# Patient Record
Sex: Female | Born: 1970 | Race: White | Hispanic: No | Marital: Married
Health system: Midwestern US, Community
[De-identification: ages and names within clinical notes are randomized; demographics above are authoritative.]

## PROBLEM LIST (undated history)

## (undated) DIAGNOSIS — Z170 Estrogen receptor positive status [ER+]: Secondary | ICD-10-CM

## (undated) DIAGNOSIS — C50911 Malignant neoplasm of unspecified site of right female breast: Principal | ICD-10-CM

## (undated) DIAGNOSIS — C50111 Malignant neoplasm of central portion of right female breast: Principal | ICD-10-CM

---

## 2019-09-06 ENCOUNTER — Emergency Department (HOSPITAL_COMMUNITY): Payer: No Typology Code available for payment source

## 2019-09-06 ENCOUNTER — Encounter (HOSPITAL_COMMUNITY): Payer: Self-pay

## 2019-09-06 ENCOUNTER — Other Ambulatory Visit: Payer: Self-pay

## 2019-09-06 ENCOUNTER — Emergency Department (HOSPITAL_COMMUNITY)
Admission: EM | Admit: 2019-09-06 | Discharge: 2019-09-06 | Disposition: A | Discharge status: Home / Self Care | Payer: No Typology Code available for payment source | Attending: Emergency Medicine | Admitting: Emergency Medicine

## 2019-09-06 DIAGNOSIS — R202 Paresthesia of skin: Secondary | ICD-10-CM | POA: Diagnosis not present

## 2019-09-06 DIAGNOSIS — Z79899 Other long term (current) drug therapy: Secondary | ICD-10-CM | POA: Insufficient documentation

## 2019-09-06 DIAGNOSIS — R519 Headache, unspecified: Secondary | ICD-10-CM | POA: Insufficient documentation

## 2019-09-06 LAB — COMPREHENSIVE METABOLIC PANEL
ALT: 24 U/L (ref 0–44)
AST: 22 U/L (ref 15–41)
Albumin: 3.9 g/dL (ref 3.5–5.0)
Alkaline Phosphatase: 79 U/L (ref 38–126)
Anion gap: 9 (ref 5–15)
BUN: 11 mg/dL (ref 6–20)
CO2: 26 mmol/L (ref 22–32)
Calcium: 9.8 mg/dL (ref 8.9–10.3)
Chloride: 104 mmol/L (ref 98–111)
Creatinine, Ser: 0.82 mg/dL (ref 0.44–1.00)
GFR calc Af Amer: >60 mL/min (ref >60)
GFR calc non Af Amer: >60 mL/min (ref >60)
Glucose, Bld: 106 mg/dL — ABNORMAL HIGH (ref 70–99)
Potassium: 3.4 mmol/L — ABNORMAL LOW (ref 3.5–5.1)
Sodium: 139 mmol/L (ref 135–145)
Total Bilirubin: 0.7 mg/dL (ref 0.3–1.2)
Total Protein: 7.8 g/dL (ref 6.5–8.1)

## 2019-09-06 LAB — DIFFERENTIAL
Abs Immature Granulocytes: 0.02 10*3/uL (ref 0.00–0.07)
Basophils Absolute: 0 10*3/uL (ref 0.0–0.1)
Basophils Relative: 0 %
Eosinophils Absolute: 0.1 10*3/uL (ref 0.0–0.5)
Eosinophils Relative: 1 %
Immature Granulocytes: 0 %
Lymphocytes Relative: 26 %
Lymphs Abs: 2.5 10*3/uL (ref 0.7–4.0)
Monocytes Absolute: 0.7 10*3/uL (ref 0.1–1.0)
Monocytes Relative: 7 %
Neutro Abs: 6.5 10*3/uL (ref 1.7–7.7)
Neutrophils Relative %: 66 %

## 2019-09-06 LAB — CBC
HCT: 44.8 % (ref 36.0–46.0)
Hemoglobin: 15.7 g/dL — ABNORMAL HIGH (ref 12.0–15.0)
MCH: 30.9 pg (ref 26.0–34.0)
MCHC: 35 g/dL (ref 30.0–36.0)
MCV: 88.2 fL (ref 80.0–100.0)
Platelets: 276 10*3/uL (ref 150–400)
RBC: 5.08 MIL/uL (ref 3.87–5.11)
RDW: 12.1 % (ref 11.5–15.5)
WBC: 9.8 10*3/uL (ref 4.0–10.5)
nRBC: 0 % (ref 0.0–0.2)

## 2019-09-06 LAB — CBG MONITORING, ED: Glucose-Capillary: 95 mg/dL (ref 70–99)

## 2019-09-06 LAB — APTT: aPTT: 25 seconds (ref 24–36)

## 2019-09-06 LAB — I-STAT CHEM 8, ED
BUN: 12 mg/dL (ref 6–20)
Calcium, Ion: 1.21 mmol/L (ref 1.15–1.40)
Chloride: 104 mmol/L (ref 98–111)
Creatinine, Ser: 0.8 mg/dL (ref 0.44–1.00)
Glucose, Bld: 106 mg/dL — ABNORMAL HIGH (ref 70–99)
HCT: 45 % (ref 36.0–46.0)
Hemoglobin: 15.3 g/dL — ABNORMAL HIGH (ref 12.0–15.0)
Potassium: 3.4 mmol/L — ABNORMAL LOW (ref 3.5–5.1)
Sodium: 141 mmol/L (ref 135–145)
TCO2: 29 mmol/L (ref 22–32)

## 2019-09-06 LAB — PROTIME-INR
INR: 1 (ref 0.8–1.2)
Prothrombin Time: 13 seconds (ref 11.4–15.2)

## 2019-09-06 MED ORDER — SODIUM CHLORIDE 0.9% FLUSH
3.0000 mL | Freq: Once | INTRAVENOUS | Status: AC
  Administered 2019-09-06: 3 mL via INTRAVENOUS

## 2019-09-06 MED ORDER — SODIUM CHLORIDE 0.9 % IV BOLUS
1000.0000 mL | Freq: Once | INTRAVENOUS | Status: AC
  Administered 2019-09-06: 1000 mL via INTRAVENOUS

## 2019-09-06 MED ORDER — GADOBUTROL 1 MMOL/ML IV SOLN
7.4000 mL | Freq: Once | INTRAVENOUS | Status: AC | PRN
  Administered 2019-09-06: 7.4 mL via INTRAVENOUS

## 2019-09-06 MED ORDER — DIPHENHYDRAMINE HCL 50 MG/ML IJ SOLN
25.0000 mg | Freq: Once | INTRAMUSCULAR | Status: AC
  Administered 2019-09-06: 25 mg via INTRAVENOUS
  Filled 2019-09-06: qty 1

## 2019-09-06 MED ORDER — KETOROLAC TROMETHAMINE 15 MG/ML IJ SOLN
15.0000 mg | Freq: Once | INTRAMUSCULAR | Status: AC
  Administered 2019-09-06: 15 mg via INTRAVENOUS
  Filled 2019-09-06: qty 1

## 2019-09-06 MED ORDER — PROCHLORPERAZINE EDISYLATE 10 MG/2ML IJ SOLN
10.0000 mg | Freq: Once | INTRAMUSCULAR | Status: AC
  Administered 2019-09-06: 10 mg via INTRAVENOUS
  Filled 2019-09-06: qty 2

## 2019-09-06 NOTE — ED Notes (Signed)
Transported to CT 

## 2019-09-06 NOTE — ED Triage Notes (Signed)
Pt went to bed last night 10pm and was normal except for a headache she has had x 1 week on left parietal and frontal head wrapping behind left eye.  No blurred vision.  Pt woke up during last night with left arm numbness, resolved after a while of being up.  Went back to sleep and woke up with right arm numbness.  Right arm is still numb.

## 2019-09-06 NOTE — ED Notes (Signed)
Patient verbalizes understanding of discharge instructions. Opportunity for questioning and answers were provided. Armband removed by staff, pt discharged from ED to home via POV with husband 

## 2019-09-06 NOTE — Discharge Instructions (Addendum)
Your CT and MRI results of your head are within normal limits.  We discussed following up for blood pressure control with your primary care physician.  If you experience any new symptoms, blurred vision, worsening headache please return to the emergency room.

## 2019-09-06 NOTE — ED Provider Notes (Signed)
[facility] EMERGENCY DEPARTMENT Provider Note   CSN: [id number] Arrival date & time: 09/06/19  1507     History Chief Complaint  Patient presents with  . Numbness  . Headache    Lorey Pallett is a 49 y.o. female.  49 y.o female with no PMH presents to the ED with a chief complaint of headache x 1 week. Patient reports an intermittent headache that began a week ago which she describes as "my muscle headaches". Sharp radiating from the front to the back of her head. She reports this resolved Friday. Saturday she experienced wooziness, states she felt unsteady on her feet. Last night she woke up in the middle of the night, and experienced dizziness along with left arm numbness, reports going back to bed and woke up this morning with right arm numbness and headache has returned. She has taken advil, aleve without improvement in symptoms. States 'headache feels different", and right arm remains numb. No neck pain, no changes in vision, no thunderclap feeling, no vomiting.   The history is provided by the patient and medical records.       History reviewed. No pertinent past medical history.  There are no problems to display for this patient.   History reviewed. No pertinent surgical history.   OB History   No obstetric history on file.     History reviewed. No pertinent family history.  Social History   Tobacco Use  . Smoking status: Not on file  Substance Use Topics  . Alcohol use: Not on file  . Drug use: Not on file    Home Medications Prior to Admission medications   Not on File    Allergies    Patient has no known allergies.  Review of Systems   Review of Systems  Constitutional: Negative for fever.  HENT: Negative for sore throat.   Respiratory: Negative for shortness of breath.   Cardiovascular: Negative for chest pain.  Gastrointestinal: Negative for abdominal pain, diarrhea, nausea and vomiting.  Genitourinary: Negative  for flank pain.  Musculoskeletal: Negative for back pain and neck pain.  Skin: Negative for pallor and wound.  Neurological: Positive for dizziness, weakness, numbness and headaches. Negative for tremors, seizures, syncope, facial asymmetry, speech difficulty and light-headedness.  All other systems reviewed and are negative.   Physical Exam Updated Vital Signs BP (!) 150/90   Pulse 73   Temp 98.4 F (36.9 C)   Resp 12   Ht 5' (1.524 m)   Wt 74.8 kg   SpO2 97%   BMI 32.22 kg/m   Physical Exam Vitals and nursing note reviewed.  Constitutional:      Appearance: She is well-developed. She is not toxic-appearing.  HENT:     Head: Normocephalic and atraumatic.  Eyes:     Extraocular Movements: Extraocular movements intact.     Pupils: Pupils are equal, round, and reactive to light.     Right eye: Pupil is round and reactive.     Left eye: Pupil is round and reactive.  Cardiovascular:     Rate and Rhythm: Normal rate.  Pulmonary:     Effort: Pulmonary effort is normal.     Breath sounds: No wheezing.  Abdominal:     Palpations: Abdomen is soft.     Tenderness: There is no abdominal tenderness.  Musculoskeletal:     Cervical back: Normal range of motion and neck supple. No rigidity.  Skin:    General: Skin is warm and dry.  Neurological:  Mental Status: She is alert and oriented to person, place, and time.     Sensory: Sensory deficit present.     Motor: No weakness.     Gait: Gait normal.     Comments: Alert, oriented, thought content appropriate. Speech fluent without evidence of aphasia. Able to follow 2 step commands without difficulty.  Cranial Nerves:  II:  Peripheral visual fields grossly normal, pupils, round, reactive to light III,IV, VI: ptosis not present, extra-ocular motions intact bilaterally  V,VII: smile symmetric, facial light touch sensation equal VIII: hearing grossly normal bilaterally  IX,X: midline uvula rise  XI: bilateral shoulder shrug equal  and strong XII: midline tongue extension  Motor:  5/5 in upper and lower extremities bilaterally including strong and equal grip strength and dorsiflexion/plantar flexion Sensory: light touch normal in all extremities EXCEPT for right arm throughout. Cerebellar: normal finger-to-nose with bilateral upper extremities, pronator drift negative Gait: normal gait and balance     ED Results / Procedures / Treatments   Labs (all labs ordered are listed, but only abnormal results are displayed) Labs Reviewed  CBC - Abnormal; Notable for the following components:      Result Value   Hemoglobin 15.7 (*)    All other components within normal limits  COMPREHENSIVE METABOLIC PANEL - Abnormal; Notable for the following components:   Potassium 3.4 (*)    Glucose, Bld 106 (*)    All other components within normal limits  I-STAT CHEM 8, ED - Abnormal; Notable for the following components:   Potassium 3.4 (*)    Glucose, Bld 106 (*)    Hemoglobin 15.3 (*)    All other components within normal limits  PROTIME-INR  APTT  DIFFERENTIAL  CBG MONITORING, ED    EKG EKG Interpretation  Date/Time:  Sunday September 06 2019 15:56:07 EDT Ventricular Rate:  91 PR Interval:    QRS Duration: 82 QT Interval:  343 QTC Calculation: 422 R Axis:   71 Text Interpretation: Sinus rhythm Borderline repolarization abnormality Confirmed by Virgel Manifold [phone]) on 09/06/2019 5:18:16 PM   Radiology CT HEAD WO CONTRAST  Result Date: 09/06/2019 CLINICAL DATA:  Numbness, tingling and headache. EXAM: CT HEAD WITHOUT CONTRAST TECHNIQUE: Contiguous axial images were obtained from the base of the skull through the vertex without intravenous contrast. COMPARISON:  None. FINDINGS: Brain: No evidence of acute infarction, hemorrhage, hydrocephalus, extra-axial collection or mass lesion/mass effect. Vascular: No hyperdense vessel or unexpected calcification. Skull: Normal. Negative for fracture or focal lesion. Sinuses/Orbits:  No acute finding. Other: None. IMPRESSION: No acute intracranial abnormality. Electronically Signed   By: Fidela Salisbury M.D.   On: 09/06/2019 16:08   MR Brain W and Wo Contrast  Result Date: 09/06/2019 CLINICAL DATA:  Atypical headache with right arm numbness EXAM: MRI HEAD WITHOUT AND WITH CONTRAST MRV HEAD WITHOUT CONTRAST TECHNIQUE: Multiplanar, multiecho pulse sequences of the brain and surrounding structures were obtained without and with intravenous contrast. Angiographic images of the intracranial venous structures were obtained using MRV technique without intravenous contrast. CONTRAST:  7.87mL GADAVIST GADOBUTROL 1 MMOL/ML IV SOLN COMPARISON:  Head CT 09/06/2019 FINDINGS: MRI BRAIN FINDINGS BRAIN: No acute infarct, acute hemorrhage or extra-axial collection. Normal white matter signal for age. Normal volume of brain parenchyma and CSF spaces. Midline structures are normal. VASCULAR: Major flow voids are preserved. Susceptibility-sensitive sequences show no chronic microhemorrhage or superficial siderosis. SKULL AND UPPER CERVICAL SPINE: Normal calvarium and skull base. Visualized upper cervical spine and soft tissues are normal. SINUSES/ORBITS: No paranasal  sinus fluid levels or advanced mucosal thickening. No mastoid or middle ear effusion. Normal orbits. MRV BRAIN FINDINGS Superior sagittal sinus: Normal. Straight sinus: Normal. Inferior sagittal sinus, vein of Galen and internal cerebral veins: Normal. Transverse sinuses: There is a diminutive left transverse sinus a common variant. Right transverse sinus is normal. Sigmoid sinuses: Normal. Visualized jugular veins: Normal. IMPRESSION: Normal MRI and MRV of the brain. Electronically Signed   By: Deatra Robinson M.D.   On: 09/06/2019 19:09   MR MRV HEAD W WO CONTRAST  Result Date: 09/06/2019 CLINICAL DATA:  Atypical headache with right arm numbness EXAM: MRI HEAD WITHOUT AND WITH CONTRAST MRV HEAD WITHOUT CONTRAST TECHNIQUE: Multiplanar,  multiecho pulse sequences of the brain and surrounding structures were obtained without and with intravenous contrast. Angiographic images of the intracranial venous structures were obtained using MRV technique without intravenous contrast. CONTRAST:  7.38mL GADAVIST GADOBUTROL 1 MMOL/ML IV SOLN COMPARISON:  Head CT 09/06/2019 FINDINGS: MRI BRAIN FINDINGS BRAIN: No acute infarct, acute hemorrhage or extra-axial collection. Normal white matter signal for age. Normal volume of brain parenchyma and CSF spaces. Midline structures are normal. VASCULAR: Major flow voids are preserved. Susceptibility-sensitive sequences show no chronic microhemorrhage or superficial siderosis. SKULL AND UPPER CERVICAL SPINE: Normal calvarium and skull base. Visualized upper cervical spine and soft tissues are normal. SINUSES/ORBITS: No paranasal sinus fluid levels or advanced mucosal thickening. No mastoid or middle ear effusion. Normal orbits. MRV BRAIN FINDINGS Superior sagittal sinus: Normal. Straight sinus: Normal. Inferior sagittal sinus, vein of Galen and internal cerebral veins: Normal. Transverse sinuses: There is a diminutive left transverse sinus a common variant. Right transverse sinus is normal. Sigmoid sinuses: Normal. Visualized jugular veins: Normal. IMPRESSION: Normal MRI and MRV of the brain. Electronically Signed   By: Deatra Robinson M.D.   On: 09/06/2019 19:09    Procedures Procedures (including critical care time)  Medications Ordered in ED Medications  sodium chloride flush (NS) 0.9 % injection 3 mL (3 mLs Intravenous Given 09/06/19 1626)  diphenhydrAMINE (BENADRYL) injection 25 mg (25 mg Intravenous Given 09/06/19 1712)  prochlorperazine (COMPAZINE) injection 10 mg (10 mg Intravenous Given 09/06/19 1712)  sodium chloride 0.9 % bolus 1,000 mL (0 mLs Intravenous Stopped 09/06/19 2202)  gadobutrol (GADAVIST) 1 MMOL/ML injection 7.4 mL (7.4 mLs Intravenous Contrast Given 09/06/19 1850)  ketorolac (TORADOL) 15 MG/ML  injection 15 mg (15 mg Intravenous Given 09/06/19 2140)    ED Course  I have reviewed the triage vital signs and the nursing notes.  Pertinent labs & imaging results that were available during my care of the patient were reviewed by me and considered in my medical decision making (see chart for details).    MDM Rules/Calculators/A&P   Patient with no pertinent past medical history presents to the ED with complaints of headache and right arm numbness, symptoms have been waxing and waning for a week, reports that her right arm numbness which is a new symptom today.  She has been taking over-the-counter medication for headaches and reports improvement until headache returned today.  She arrived in the ED hypertensive, does not have any prior history of hypertension with a blood pressure of 192/110, slight elevation in heart rate at 110.  Denies any fevers, blurred vision, nausea, vomiting.  She does report feeling unsteady on her feet and having her husband help her ambulate while they were on the beach trip a couple days ago.  Differential diagnoses included but not limited to CVA, venous thrombosis, migraine.  During evaluation she  is overall well-appearing, remains hypertensive, exam without motor dysfunction, but does different sensation to the RIGHT ARM, this does not extend in a dermatomal pattern. No skin changes. No trauma.  CT Head showed: No acute intracranial abnormality.  4:47 PM Spoke to neurology Dr. Otelia Limes of neurology who recommended MRI Brain/MRV.   Interpretation of her labs with a CMP without electrolyte normality, current levels within normal limits.  LFTs are unremarkable.  CBC remarkable for elevated hemoglobin.PT/INR within normal limits. Given HA cocktail while awaiting MRI/MRV of her brain.   MRI/MRV of the head are within normal limits.  8:31 PM results of her MRI, CT along with blood work were discussed with patient at length.  She reports mild improvement in her  symptoms, states she feels like the right hand now has blood supply after being squeezed on by the cuff..  10:09 PM patient was reevaluated by me, does report improvement in her headache after Toradol.  She has received fluids while in the ED.  We discussed blood pressure control with ECP follow-up, she is agreeable of establishing care with a PCP along with monitor her blood pressure while at home.  Strict return precautions were discussed with her and husband at the bedside at length.  Patient stable for discharge.    Portions of this note were generated with Scientist, clinical (histocompatibility and immunogenetics). Dictation errors may occur despite best attempts at proofreading.  Final Clinical Impression(s) / ED Diagnoses Final diagnoses:  Acute nonintractable headache, unspecified headache type  Paresthesia    Rx / DC Orders ED Discharge Orders    None       Freddy Jaksch 09/06/19 2209    Raeford Razor, MD 09/07/19 [phone]

## 2019-09-14 ENCOUNTER — Emergency Department (HOSPITAL_COMMUNITY): Payer: PRIVATE HEALTH INSURANCE

## 2019-09-14 ENCOUNTER — Emergency Department (HOSPITAL_COMMUNITY)
Admission: EM | Admit: 2019-09-14 | Discharge: 2019-09-14 | Disposition: A | Discharge status: Home / Self Care | Payer: PRIVATE HEALTH INSURANCE | Attending: Emergency Medicine | Admitting: Emergency Medicine

## 2019-09-14 ENCOUNTER — Other Ambulatory Visit: Payer: Self-pay

## 2019-09-14 ENCOUNTER — Encounter (HOSPITAL_COMMUNITY): Payer: Self-pay

## 2019-09-14 DIAGNOSIS — R202 Paresthesia of skin: Secondary | ICD-10-CM | POA: Diagnosis not present

## 2019-09-14 DIAGNOSIS — R079 Chest pain, unspecified: Secondary | ICD-10-CM | POA: Diagnosis not present

## 2019-09-14 DIAGNOSIS — R030 Elevated blood-pressure reading, without diagnosis of hypertension: Secondary | ICD-10-CM | POA: Diagnosis present

## 2019-09-14 LAB — BASIC METABOLIC PANEL
Anion gap: 11 (ref 5–15)
BUN: 16 mg/dL (ref 6–20)
CO2: 26 mmol/L (ref 22–32)
Calcium: 9.6 mg/dL (ref 8.9–10.3)
Chloride: 100 mmol/L (ref 98–111)
Creatinine, Ser: 0.77 mg/dL (ref 0.44–1.00)
GFR calc Af Amer: >60 mL/min (ref >60)
GFR calc non Af Amer: >60 mL/min (ref >60)
Glucose, Bld: 91 mg/dL (ref 70–99)
Potassium: 3.7 mmol/L (ref 3.5–5.1)
Sodium: 137 mmol/L (ref 135–145)

## 2019-09-14 LAB — HEPATIC FUNCTION PANEL
ALT: 21 U/L (ref 0–44)
AST: 20 U/L (ref 15–41)
Albumin: 4.4 g/dL (ref 3.5–5.0)
Alkaline Phosphatase: 87 U/L (ref 38–126)
Bilirubin, Direct: <0.1 mg/dL (ref 0.0–0.2)
Total Bilirubin: 0.9 mg/dL (ref 0.3–1.2)
Total Protein: 8.3 g/dL — ABNORMAL HIGH (ref 6.5–8.1)

## 2019-09-14 LAB — CBC
HCT: 47.6 % — ABNORMAL HIGH (ref 36.0–46.0)
Hemoglobin: 16.6 g/dL — ABNORMAL HIGH (ref 12.0–15.0)
MCH: 30.8 pg (ref 26.0–34.0)
MCHC: 34.9 g/dL (ref 30.0–36.0)
MCV: 88.3 fL (ref 80.0–100.0)
Platelets: 329 10*3/uL (ref 150–400)
RBC: 5.39 MIL/uL — ABNORMAL HIGH (ref 3.87–5.11)
RDW: 12.1 % (ref 11.5–15.5)
WBC: 8.5 10*3/uL (ref 4.0–10.5)
nRBC: 0 % (ref 0.0–0.2)

## 2019-09-14 LAB — TROPONIN I (HIGH SENSITIVITY)
Troponin I (High Sensitivity): 7 ng/L (ref <18)
Troponin I (High Sensitivity): 20 ng/L — ABNORMAL HIGH (ref <18)

## 2019-09-14 MED ORDER — SODIUM CHLORIDE 0.9% FLUSH: 3.0000 mL | Freq: Once | INTRAVENOUS | Status: DC

## 2019-09-14 MED ORDER — LOSARTAN POTASSIUM 50 MG PO TABS: 50.0000 mg | ORAL_TABLET | Freq: Every day | ORAL | 0 refills | Status: DC

## 2019-09-14 MED ORDER — IOHEXOL 350 MG/ML SOLN
100.0000 mL | Freq: Once | INTRAVENOUS | Status: AC | PRN
  Administered 2019-09-14: 100 mL via INTRAVENOUS

## 2019-09-14 MED ORDER — LOSARTAN POTASSIUM 50 MG PO TABS
50.0000 mg | ORAL_TABLET | Freq: Once | ORAL | Status: AC
  Administered 2019-09-14: 50 mg via ORAL
  Filled 2019-09-14: qty 1

## 2019-09-14 NOTE — ED Notes (Signed)
Pt given sandwich and water.

## 2019-09-14 NOTE — ED Triage Notes (Signed)
Patient reports that she had noticed that her bp has remain elevated since being seen for code stroke on easter that was all negative. Has never been treated for HTN in past. Was not prescribed meds at discharge. No other associated symptoms

## 2019-09-14 NOTE — ED Provider Notes (Signed)
[facility] EMERGENCY DEPARTMENT Provider Note   CSN: [id number] Arrival date & time: 09/14/19  1032     History No chief complaint on file.   Shelva Hetzer is a 49 y.o. female.  49 y.o female with a PMH of new onset HTN, Anxiety presents to the ED with a chief complaint of elevated blood pressure and left foot pain. Patient was seen in the ED over a week ago for paresthesias along with elevated BP. She was not started on blood pressure medication as she had no prior history of this.  She reports she did schedule an appointment with her PCP, she is unable to see them in total the end of April.  States she has bought a blood pressure monitor, has been measuring her blood pressure at home 3-4 x during the day.  Patient had some lifestyle changes such as decreasing salt intake, changing her diet, reports going to walk along with decreasing her stress level.  On today's visit she has multiple symptoms which include recurrent headaches since her last visit to the ED.  She has taken some Aleve with no improvement.  She also reports chest tightness, feels like this pain does not radiate anywhere however feels also a discomfort in between her shoulder blades.  Reports at night she feels like she is having palpitations, does get short of breath sometimes.  She also has left foot pain, reports no trauma or injury to the area however there is bruising noted to the dorsum aspect in between the inner digits.  She does not have any prior cardiac history, no prior history of blood clots, no other complaints.  Of note, patient does have a prior history of Bacot use.  The history is provided by the patient.  Hypertension This is a new problem. The current episode started more than 1 week ago. The problem occurs constantly. The problem has been gradually worsening. Associated symptoms include chest pain, headaches and shortness of breath. Pertinent negatives include no abdominal pain.  The symptoms are aggravated by stress. She has tried acetaminophen for the symptoms.       History reviewed. No pertinent past medical history.  There are no problems to display for this patient.   History reviewed. No pertinent surgical history.   OB History   No obstetric history on file.     No family history on file.  Social History   Tobacco Use  . Smoking status: Not on file  Substance Use Topics  . Alcohol use: Not on file  . Drug use: Not on file    Home Medications Prior to Admission medications   Medication Sig Start Date End Date Taking? Authorizing Provider  buPROPion (WELLBUTRIN XL) 150 MG 24 hr tablet Take 75 mg by mouth every morning. 05/20/19  Yes [provider]  diphenhydrAMINE (BENADRYL) 25 MG tablet Take 50 mg by mouth at bedtime.   Yes [provider]  loratadine (CLARITIN) 10 MG tablet Take 10 mg by mouth daily.   Yes [provider]  naproxen sodium (ALEVE) 220 MG tablet Take 660 mg by mouth daily as needed (pain).   Yes [provider]    Allergies    Aspirin and Ibuprofen  Review of Systems   Review of Systems  Constitutional: Negative for fever.  HENT: Negative for sore throat.   Respiratory: Positive for shortness of breath.   Cardiovascular: Positive for chest pain.  Gastrointestinal: Negative for abdominal pain, nausea and vomiting.  Genitourinary: Negative  for flank pain.  Musculoskeletal: Positive for back pain.  Skin: Negative for pallor and wound.  Neurological: Positive for weakness and headaches. Negative for dizziness, speech difficulty and light-headedness.  All other systems reviewed and are negative.   Physical Exam Updated Vital Signs BP (!) 184/102   Pulse 99   Temp 98.6 F (37 C) (Oral)   Resp 16   SpO2 98%   Physical Exam Vitals and nursing note reviewed.  Constitutional:      Appearance: Normal appearance. She is not ill-appearing, toxic-appearing or diaphoretic.  HENT:       Head: Normocephalic and atraumatic.     Mouth/Throat:     Mouth: Mucous membranes are moist.  Eyes:     Pupils: Pupils are equal, round, and reactive to light.  Cardiovascular:     Rate and Rhythm: Normal rate.     Pulses:          Dorsalis pedis pulses are 2+ on the right side and 2+ on the left side.       Posterior tibial pulses are 2+ on the right side and 2+ on the left side.     Heart sounds: No murmur.     Comments: No BL pitting edema.  Pulmonary:     Effort: Pulmonary effort is normal.     Breath sounds: Normal breath sounds. No stridor. No wheezing or rales.     Comments: Lungs are clear to auscultation.  Chest:     Chest wall: No tenderness.  Abdominal:     General: Abdomen is flat.     Tenderness: There is no abdominal tenderness. There is no right CVA tenderness or left CVA tenderness.  Musculoskeletal:       Back:       Feet:     Comments: Pain is not reproducible with palpation, no changes in the skin, no vesicular rash noted.  Feet:     Comments: Bruising noted to the anterior phalanges. Skin:    General: Skin is warm and dry.  Neurological:     Mental Status: She is alert and oriented to person, place, and time.     ED Results / Procedures / Treatments   Labs (all labs ordered are listed, but only abnormal results are displayed) Labs Reviewed  CBC - Abnormal; Notable for the following components:      Result Value   RBC 5.39 (*)    Hemoglobin 16.6 (*)    HCT 47.6 (*)    All other components within normal limits  HEPATIC FUNCTION PANEL - Abnormal; Notable for the following components:   Total Protein 8.3 (*)    All other components within normal limits  BASIC METABOLIC PANEL  TROPONIN I (HIGH SENSITIVITY)    EKG None  Radiology No results found.  Procedures Procedures (including critical care time)  Medications Ordered in ED Medications  sodium chloride flush (NS) 0.9 % injection 3 mL (has no administration in time range)    ED  Course  I have reviewed the triage vital signs and the nursing notes.  Pertinent labs & imaging results that were available during my care of the patient were reviewed by me and considered in my medical decision making (see chart for details).  Clinical Course as of Sep 14 1519  Mon Sep 14, 2019  1512 Previous extensive workup for stroke which was negative. Not on BP meds, new PMD appointment end of month. Pending trop, CTA/dissection study. If neg, plan to d/c and start  bp meds   [KM]    Clinical Course User Index [KM] Jeral Pinch   MDM Rules/Calculators/A&P   Patient with no PMH presents to the ED with a chief complaint of elevated blood pressure.  Patient was seen here approximately over a week ago, for the same complaint.  She reports purchasing a blood pressure monitor at home, has been measuring her blood pressure 3-4 times a day.  Does report the diastolic numbers elevated in the 100 range.  She is attempted modification and lifestyle, reducing salt intake, decreasing stress, walking.  Reports she did schedule an appoint with the PCP however this is not until the end of the month.  She also reports feeling a discomfort to her chest, feeling somewhat of a tightness, occurs with shortness of breath which has been occurring at night when she feels "palpitations".  She also reports pain between her shoulder blades, this is around the thoracic area, this is non reproducible with palpation.  No obvious rashes, no vesicular lesion noted.  Lungs are clear to auscultation, heart rate slightly increased around the 100 range with palpable radial pulse.  She does report the paresthesias occurring to her arm in the past visit have continued, states they have improved however, during her last visit patient did have a negative CT head, MRI brain, MRV.  Interpretation of her labs remarkable for a BMP without any electrolyte abnormality, creatinine level is within normal limits.  CBC without  any leukocytosis, hemoglobin slightly elevated.  Does have a previous history of smoking but currently denies any use.  A troponin was added due to patient's new symptoms of cardiac component, no prior cardiac history, no prior history of blood clots.  She is overall well appearing. Concern for Cardiac vs dissection etiology with new onset of chest tightness along with back pain.  Does not have any prior history of this.  Neurological wise patient is intact.  Vitals on today's visit were remarkable for a blood pressure of 184/102, remains elevated, heart rate thus appear increased.  We discussed stress levels exacerbating her symptoms, reports she has tried to cut this off from daily regimen.  We will be obtaining a CT angio chest to rule out dissection.  She is currently pending dissection study along with troponin.  This plan and management was discussed with patient.  I have also discussed case with Dr. Criss Alvine who has also seen patient and agrees with management.  Patient care signed out to [facility]. PA at shift change for proper disposition.     Portions of this note were generated with Scientist, clinical (histocompatibility and immunogenetics). Dictation errors may occur despite best attempts at proofreading.  Final Clinical Impression(s) / ED Diagnoses Final diagnoses:  Elevated blood pressure reading    Rx / DC Orders ED Discharge Orders    None       Claude Manges, PA-C 09/14/19 1521    Pricilla Loveless, MD 09/24/19 [phone]

## 2019-09-14 NOTE — ED Notes (Signed)
Patient transported to XR. 

## 2019-09-14 NOTE — Discharge Instructions (Signed)
You were seen today for chest pain, arm numbness and headaches. Your workup was reassuring for no blood clot in your lungs or dissection in your main arteries.  Your blood pressure was elevated.  I discussed all of your symptoms and findings with the cardiologist Dr. Anne Fu.  He believes that your work-up is also reassuring but would also like to have you follow-up with him in his office.  Please call his office at your earliest convenience.  We will start you on losartan 50 mg today for your blood pressure.  Your symptoms of your headache and your arm numbness may potentially be due to a pinched nerve in your neck.  Also, incidentally we saw some inflammation in an artery in your stomach on your scan.  You can follow-up with your primary care doctor in regards to both of these nonemergent issues. Thank you for allowing me to care for you today. Please return to the emergency department if you have new or worsening symptoms. Take your medications as instructed.

## 2019-09-15 ENCOUNTER — Ambulatory Visit (INDEPENDENT_AMBULATORY_CARE_PROVIDER_SITE_OTHER): Payer: PRIVATE HEALTH INSURANCE | Admitting: Cardiology

## 2019-09-15 ENCOUNTER — Encounter: Payer: Self-pay | Admitting: Cardiology

## 2019-09-15 VITALS — BP 178/98 | HR 72 | Ht 60.0 in | Wt 162.8 lb

## 2019-09-15 DIAGNOSIS — R079 Chest pain, unspecified: Secondary | ICD-10-CM | POA: Diagnosis not present

## 2019-09-15 DIAGNOSIS — Z8249 Family history of ischemic heart disease and other diseases of the circulatory system: Secondary | ICD-10-CM

## 2019-09-15 DIAGNOSIS — I10 Essential (primary) hypertension: Secondary | ICD-10-CM | POA: Diagnosis not present

## 2019-09-15 MED ORDER — METOPROLOL TARTRATE 100 MG PO TABS: ORAL_TABLET | ORAL | 0 refills | Status: AC

## 2019-09-15 NOTE — Patient Instructions (Addendum)
Your physician recommends that you continue on your current medications as directed. Please refer to the Current Medication list given to you today.   Your physician recommends that you return for lab work in:  [place] TSH AND FREE T4  Your physician recommends that you schedule a follow-up appointment in: 1 MONTH HTN CLINIC   Your cardiac CT will be scheduled at one of the below locations:   [facility] [address] [place], [place] [address] [phone]   If scheduled at [facility], please arrive at the [facility] main entrance of [facility] 30 minutes prior to test start time. Proceed to the [facility] Radiology Department (first floor) to check-in and test prep.  If scheduled at [facility], please arrive 15 mins early for check-in and test prep.  Please follow these instructions carefully (unless otherwise directed):  Hold all erectile dysfunction medications at least 3 days (72 hrs) prior to test.  On the Night Before the Test: . Be sure to Drink plenty of water. . Do not consume any caffeinated/decaffeinated beverages or chocolate 12 hours prior to your test. . Do not take any antihistamines 12 hours prior to your test. . If you take Metformin do not take 24 hours prior to test. . If the patient has contrast allergy: ? Patient will need a prescription for Prednisone and very clear instructions (as follows): 1. Prednisone 50 mg - take 13 hours prior to test 2. Take another Prednisone 50 mg 7 hours prior to test 3. Take another Prednisone 50 mg 1 hour prior to test 4. Take Benadryl 50 mg 1 hour prior to test . Patient must complete all four doses of above prophylactic medications. . Patient will need a ride after test due to Benadryl.  On the Day of the Test: . Drink plenty of water. Do not drink any water within one hour of the test. . Do not eat any food 4 hours prior to the test. . You may take your  regular medications prior to the test.  . Take metoprolol (Lopressor) two hours prior to test. . HOLD Furosemide/Hydrochlorothiazide morning of the test. . FEMALES- please wear underwire-free bra if available   *For Clinical Staff only. Please instruct patient the following:*        -Drink plenty of water             -Take metoprolol (Lopressor) 2 hours prior to test (if applicable).                  -If HR is less than 55 BPM- No Lopressor                -IF HR is greater than 55 BPM and patient is less than or equal to 72 yrs old Lopressor 100mg  x1.                -If HR is greater than 55 BPM and patient is greater than 73 yrs old Lopressor 50 mg x1.     Do not give Lopressor to patients with an allergy to lopressor or anyone with asthma or active COPD symptoms (currently taking steroids).       After the Test: . Drink plenty of water. . After receiving IV contrast, you may experience a mild flushed feeling. This is normal. . On occasion, you may experience a mild rash up to 24 hours after the test. This is not dangerous. If this occurs, you  can take Benadryl 25 mg and increase your fluid intake. . If you experience trouble breathing, this can be serious. If it is severe call 911 IMMEDIATELY. If it is mild, please call our office. . If you take any of these medications: Glipizide/Metformin, Avandament, Glucavance, please do not take 48 hours after completing test unless otherwise instructed.   Once we have confirmed authorization from your insurance company, we will call you to set up a date and time for your test.   For non-scheduling related questions, please contact the cardiac imaging nurse navigator should you have any questions/concerns: Rockwell Alexandria, RN Navigator Cardiac Imaging [facility] [phone] office  For scheduling needs, including cancellations and rescheduling, please call [phone].

## 2019-09-15 NOTE — Progress Notes (Signed)
Cardiology Office Note:    Date:  09/15/2019   ID:  Eileen Barnett, DOB 09-01-1970, MRN [id number]  PCP:  Patient, No Pcp Per  Cardiologist:  No primary care provider on file.  Electrophysiologist:  None   Referring MD: Lucrezia Starch, MD     History of Present Illness:    Eileen Barnett is a 49 y.o. female here for the evaluation of chest pain at the request of Dr. Roslynn Amble.  Has a family history of early coronary artery disease with her cousin having 2 stents placed and 2 heart attacks in their 27s.  Her mother suffers with hypertension.  Over the last several years she is always posted that she has had good blood pressure but recently it has been quite elevated.  She has bought a home blood pressure cuff for recordings and usually they are quite high at home sometimes with diastolics greater than 932.  She went to the emergency room with left-sided chest discomfort left shoulder discomfort that was off and on over the last 2 weeks.  Quite nervous about this.  She also felt some palpitations surrounding these events.  I was called from the ER and we discussed her case.  Her CT scan for PE was negative.  I did not observe any obvious coronary calcium on the scan.  Her troponin was minimally elevated at 20 upon second check.  This may have been secondary to her highly elevated blood pressure.  EKG overall reassuring.  I decided to start losartan 50 mg.  She will continue to try this medication.  140/90 this AM  Non-smoker, no drug use.  She does believe that she is perimenopausal.  Has an upcoming PCP appointment.  History reviewed. No pertinent past medical history.  History reviewed. No pertinent surgical history.  Current Medications: Current Meds  Medication Sig  . buPROPion (WELLBUTRIN XL) 150 MG 24 hr tablet Take 75 mg by mouth every morning.  . diphenhydrAMINE (BENADRYL) 25 MG tablet Take 50 mg by mouth at bedtime.  Marland Kitchen loratadine (CLARITIN) 10 MG  tablet Take 10 mg by mouth daily.  Marland Kitchen losartan (COZAAR) 50 MG tablet Take 1 tablet (50 mg total) by mouth daily.     Allergies:   Aspirin and Ibuprofen   Social History   Socioeconomic History  . Marital status: Married    Spouse name: Not on file  . Number of children: Not on file  . Years of education: Not on file  . Highest education level: Not on file  Occupational History  . Not on file  Tobacco Use  . Smoking status: Former Smoker    Types: Cigarettes  . Smokeless tobacco: Never Used  . Tobacco comment: age 69  Substance and Sexual Activity  . Alcohol use: Yes    Comment: 1 beer on weekend  . Drug use: Not Currently    Types: Marijuana    Comment: age 51  . Sexual activity: Not on file  Other Topics Concern  . Not on file  Social History Narrative  . Not on file   Social Determinants of Health   Financial Resource Strain:   . Difficulty of Paying Living Expenses:   Food Insecurity:   . Worried About Charity fundraiser in the Last Year:   . Arboriculturist in the Last Year:   Transportation Needs:   . Film/video editor (Medical):   Marland Kitchen Lack of Transportation (Non-Medical):   Physical Activity:   . Days of  Exercise per Week:   . Minutes of Exercise per Session:   Stress:   . Feeling of Stress :   Social Connections:   . Frequency of Communication with Friends and Family:   . Frequency of Social Gatherings with Friends and Family:   . Attends Religious Services:   . Active Member of Clubs or Organizations:   . Attends Banker Meetings:   Marland Kitchen Marital Status:      Family History: The patient's family history includes Hypertension in her mother.  ROS:   Please see the history of present illness.    Currently denies any fevers chills nausea vomiting syncope bleeding.  Her chest pain is subsided.  All other systems reviewed and are negative.  EKGs/Labs/Other Studies Reviewed:    The following studies were reviewed today: See above.  CT  scan personally reviewed.  No coronary calcium noted.  EKG:  EKG is not ordered today.  EKG from yesterday shows no ischemic changes.  Personally reviewed  Recent Labs: 09/14/2019: ALT 21; BUN 16; Creatinine, Ser 0.77; Hemoglobin 16.6; Platelets 329; Potassium 3.7; Sodium 137  Recent Lipid Panel No results found for: CHOL, TRIG, HDL, CHOLHDL, VLDL, LDLCALC, LDLDIRECT  Physical Exam:    VS:  BP (!) 178/98 Comment: per [place]  Pulse 72   Ht 5' (1.524 m)   Wt 162 lb 12.8 oz (73.8 kg)   BMI 31.79 kg/m     Wt Readings from Last 3 Encounters:  09/15/19 162 lb 12.8 oz (73.8 kg)  09/06/19 165 lb (74.8 kg)     GEN:  Well nourished, well developed in no acute distress HEENT: Normal NECK: No JVD; No carotid bruits LYMPHATICS: No lymphadenopathy CARDIAC: RRR, no murmurs, rubs, gallops RESPIRATORY:  Clear to auscultation without rales, wheezing or rhonchi  ABDOMEN: Soft, non-tender, non-distended MUSCULOSKELETAL:  No edema; No deformity  SKIN: Warm and dry NEUROLOGIC:  Alert and oriented x 3 PSYCHIATRIC:  Normal affect   ASSESSMENT:    1. Chest pain, unspecified type   2. Essential hypertension   3. Family history of early CAD    PLAN:    In order of problems listed above:  Chest discomfort-atypical -We will go ahead and check a coronary CT scan with possible FFR analysis to ensure that she does not have any significant plaque or flow-limiting disease.  She does have a cousin who in their 67s had MI.  She has been worried frequently about her overall heart health.  Essential hypertension -Very high blood pressure readings noted in the emergency room yesterday.  She has a home blood pressure cuff which is also been showing higher than normal readings.  I started losartan 50 mg last night and this morning she states that she was 140/90.  I did caution her against possible stress causing increased blood pressure readings at home.  She understands. -I will have her follow-up with  her hypertension clinic in 1 month.  Certainly, she may need hydrochlorothiazide addition, possible amlodipine etc. -I will go ahead and check a TSH and a free T4.  Family history of CAD -We will check a lipid panel   Medication Adjustments/Labs and Tests Ordered: Current medicines are reviewed at length with the patient today.  Concerns regarding medicines are outlined above.  Orders Placed This Encounter  Procedures  . CT CORONARY MORPH W/CTA COR W/SCORE W/CA W/CM &/OR WO/CM  . CT CORONARY FRACTIONAL FLOW RESERVE DATA PREP  . CT CORONARY FRACTIONAL FLOW RESERVE FLUID ANALYSIS  .  TSH  . Lipid panel  . T4, free   Meds ordered this encounter  Medications  . metoprolol tartrate (LOPRESSOR) 100 MG tablet    Sig: TAKE 1 TAB 2 HOURS BEFORE CT    Dispense:  1 tablet    Refill:  0    Patient Instructions  Your physician recommends that you continue on your current medications as directed. Please refer to the Current Medication list given to you today.   Your physician recommends that you return for lab work in:  TODAY LIPID TSH AND FREE T4  Your physician recommends that you schedule a follow-up appointment in: 1 MONTH HTN CLINIC   Your cardiac CT will be scheduled at one of the below locations:   [facility] [address] [place], [place] [address] [phone]   If scheduled at [facility], please arrive at the [facility] main entrance of [facility] 30 minutes prior to test start time. Proceed to the [facility] Radiology Department (first floor) to check-in and test prep.  If scheduled at [facility], please arrive 15 mins early for check-in and test prep.  Please follow these instructions carefully (unless otherwise directed):  Hold all erectile dysfunction medications at least 3 days (72 hrs) prior to test.  On the Night Before the Test: . Be sure to Drink plenty of water. . Do not consume any  caffeinated/decaffeinated beverages or chocolate 12 hours prior to your test. . Do not take any antihistamines 12 hours prior to your test. . If you take Metformin do not take 24 hours prior to test. . If the patient has contrast allergy: ? Patient will need a prescription for Prednisone and very clear instructions (as follows): 1. Prednisone 50 mg - take 13 hours prior to test 2. Take another Prednisone 50 mg 7 hours prior to test 3. Take another Prednisone 50 mg 1 hour prior to test 4. Take Benadryl 50 mg 1 hour prior to test . Patient must complete all four doses of above prophylactic medications. . Patient will need a ride after test due to Benadryl.  On the Day of the Test: . Drink plenty of water. Do not drink any water within one hour of the test. . Do not eat any food 4 hours prior to the test. . You may take your regular medications prior to the test.  . Take metoprolol (Lopressor) two hours prior to test. . HOLD Furosemide/Hydrochlorothiazide morning of the test. . FEMALES- please wear underwire-free bra if available   *For Clinical Staff only. Please instruct patient the following:*        -Drink plenty of water             -Take metoprolol (Lopressor) 2 hours prior to test (if applicable).                  -If HR is less than 55 BPM- No Lopressor                -IF HR is greater than 55 BPM and patient is less than or equal to 32 yrs old Lopressor 100mg  x1.                -If HR is greater than 55 BPM and patient is greater than 38 yrs old Lopressor 50 mg x1.     Do not give Lopressor to patients with an allergy to lopressor or anyone with asthma or active COPD symptoms (currently  taking steroids).       After the Test: . Drink plenty of water. . After receiving IV contrast, you may experience a mild flushed feeling. This is normal. . On occasion, you may experience a mild rash up to 24 hours after the test. This is not dangerous. If this occurs, you can take Benadryl  25 mg and increase your fluid intake. . If you experience trouble breathing, this can be serious. If it is severe call 911 IMMEDIATELY. If it is mild, please call our office. . If you take any of these medications: Glipizide/Metformin, Avandament, Glucavance, please do not take 48 hours after completing test unless otherwise instructed.   Once we have confirmed authorization from your insurance company, we will call you to set up a date and time for your test.   For non-scheduling related questions, please contact the cardiac imaging nurse navigator should you have any questions/concerns: Rockwell Alexandria, RN Navigator Cardiac Imaging [facility] [phone] office  For scheduling needs, including cancellations and rescheduling, please call [phone].      Signed, Donato Schultz, MD  09/15/2019 4:36 PM    [facility]

## 2019-09-16 LAB — TSH: TSH: 1.86 u[IU]/mL (ref 0.450–4.500)

## 2019-09-16 LAB — LIPID PANEL
Chol/HDL Ratio: 4.4 ratio (ref 0.0–4.4)
Cholesterol, Total: 215 mg/dL — ABNORMAL HIGH (ref 100–199)
HDL: 49 mg/dL (ref >39)
LDL Chol Calc (NIH): 148 mg/dL — ABNORMAL HIGH (ref 0–99)
Triglycerides: 100 mg/dL (ref 0–149)
VLDL Cholesterol Cal: 18 mg/dL (ref 5–40)

## 2019-09-16 LAB — T4, FREE: Free T4: 1.49 ng/dL (ref 0.82–1.77)

## 2019-10-13 ENCOUNTER — Telehealth (HOSPITAL_COMMUNITY): Payer: Self-pay | Admitting: Emergency Medicine

## 2019-10-13 NOTE — Telephone Encounter (Signed)
Reaching out to patient to offer assistance regarding upcoming cardiac imaging study; pt verbalizes understanding of appt date/time, parking situation and where to check in, pre-test NPO status and medications ordered, and verified current allergies; name and call back number provided for further questions should they arise Rockwell Alexandria RN Navigator Cardiac Imaging [facility] [phone] office [phone] cell   Pt verbalized understanding to take 100mg  metoprolol tartrate 2 hr prior to scan 

## 2019-10-14 ENCOUNTER — Other Ambulatory Visit: Payer: Self-pay

## 2019-10-14 ENCOUNTER — Ambulatory Visit (HOSPITAL_COMMUNITY)
Admission: RE | Admit: 2019-10-14 | Discharge: 2019-10-14 | Disposition: A | Discharge status: Home / Self Care | Payer: No Typology Code available for payment source | Source: Ambulatory Visit | Attending: Cardiology | Admitting: Cardiology

## 2019-10-14 ENCOUNTER — Encounter (HOSPITAL_COMMUNITY): Payer: Self-pay

## 2019-10-14 DIAGNOSIS — R079 Chest pain, unspecified: Secondary | ICD-10-CM | POA: Diagnosis present

## 2019-10-14 MED ORDER — NITROGLYCERIN 0.4 MG SL SUBL
0.8000 mg | SUBLINGUAL_TABLET | Freq: Once | SUBLINGUAL | Status: AC
  Administered 2019-10-14: 0.8 mg via SUBLINGUAL

## 2019-10-14 MED ORDER — NITROGLYCERIN 0.4 MG SL SUBL
SUBLINGUAL_TABLET | SUBLINGUAL | Status: AC
  Filled 2019-10-14: qty 2

## 2019-10-14 MED ORDER — IOHEXOL 350 MG/ML SOLN
75.0000 mL | Freq: Once | INTRAVENOUS | Status: AC | PRN
  Administered 2019-10-14: 75 mL via INTRAVENOUS

## 2019-10-15 ENCOUNTER — Ambulatory Visit (INDEPENDENT_AMBULATORY_CARE_PROVIDER_SITE_OTHER): Payer: PRIVATE HEALTH INSURANCE | Admitting: Cardiology

## 2019-10-15 VITALS — BP 128/86 | HR 67

## 2019-10-15 DIAGNOSIS — I10 Essential (primary) hypertension: Secondary | ICD-10-CM | POA: Diagnosis not present

## 2019-10-15 MED ORDER — LOSARTAN POTASSIUM-HCTZ 50-12.5 MG PO TABS: ORAL_TABLET | ORAL | 0 refills | Status: DC

## 2019-10-15 NOTE — Progress Notes (Signed)
Patient ID: Eileen Barnett                 DOB: Dec 18, 1970                      MRN: [id number]     HPI: Eileen Barnett is a 49 y.o. female referred by Dr. Anne Fu to HTN clinic. No PMH or PCP.  Patient presented to ED on 09/06/19 with headache x 1 week, dizziness, and left arm numbness not improved with Advil or Aleve.  BP was found to be 192/110 with elevated HR of 110.  Decreased to 150/90 and instructed to monitor BP at home.  Seen again at ED on 4/12 with elevated BP of 184/102.  Patient reported diastolic readings at home over 100.  Referred to Dr. Anne Fu and seen on 4/13 who started losartan 50 mg once daily and was referred to HTN clinic.  Lab work showed elevated TC (215) and LDL (148).  Patient presents today for first visit to HTN clinic.  Is taking blood pressure once daily using right arm at different times of the day.  Takes losartan in the morning.  Reports no adverse effects to medication but having frequent perimenopausal symptoms such as hot flashes.  Home BP readings trending down in past month.  5/12: 116/81 5/11: 122/73 5/10: 125/85 5/9: 117/76 5/8: 130/84 5/7: 143/88 5/6: 128/86 5/5: 127/79 5/4: 133/90 5/3: 134/81 5/2: 144/89 5/1: 136/88  4/21: 142/95 4/20: 143/96 4/19: 150/103 4/18: 147/102  Reports headaches and chest pain have almost completely gone away and Is now established with a PCP.  Knows her LDL cholesterol is high but reports Dr Caro Hight is waiting on results of CT scan before treating.  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  Walks with her husband on weekends and is trying to increase exercise during the week by walking her dogs.  Is trying to avoid salt in her diet and has reduced caffeine intake in the morning.  Is now drinking 1/2 regular coffee and 1/2 decaffeinated coffee.   Current HTN meds: losartan 50 mg daily  Previously tried: none  BP  goal: 130/80  Family History: Mother: HTN, Cousin: 2 Mis, CAD, Maternal Grandmother: HTN  Social History: Former tobacco and marijuana smoker, occasional alcohol  Home BP readings:   Wt Readings from Last 3 Encounters:  09/15/19 162 lb 12.8 oz (73.8 kg)  09/06/19 165 lb (74.8 kg)   BP Readings from Last 3 Encounters:  10/14/19 110/75  09/15/19 (!) 178/98  09/14/19 (!) 164/104   Pulse Readings from Last 3 Encounters:  09/15/19 72  09/14/19 96  09/06/19 73    Renal function: CrCl cannot be calculated (Patient's most recent lab result is older than the maximum 21 days allowed.).  No past medical history on file.  Current Outpatient Medications on File Prior to Visit  Medication Sig Dispense Refill  . buPROPion (WELLBUTRIN XL) 150 MG 24 hr tablet Take 75 mg by mouth every morning.    . diphenhydrAMINE (BENADRYL) 25 MG tablet Take 50 mg by mouth at bedtime.    Marland Kitchen loratadine (CLARITIN) 10 MG tablet Take 10 mg by mouth daily.    Marland Kitchen losartan (COZAAR) 50 MG tablet Take 1 tablet (50 mg total) by mouth daily. 30 tablet 0  . metoprolol tartrate (LOPRESSOR) 100 MG tablet TAKE 1 TAB 2 HOURS BEFORE CT 1 tablet 0   No  current facility-administered medications on file prior to visit.    Allergies  Allergen Reactions  . Aspirin Anaphylaxis and Hives  . Ibuprofen Anaphylaxis and Hives     Assessment/Plan:  1. Hypertension - Patient's blood pressure has improved greatly since her ED visits and has reached goal of <130/80 one time.  Counseled patient to continue to avoid adding salt to foods and to increase exercise to at least 30 minutes a day at least 5 days a week.  Patient would likely benefit from addition of thiazide diuretic and will combine with losartan to decrease pill burden.  Advised patient use her left arm to check her blood pressure. Counseled patient to take losartan/hctz in morning and will contact patient on Monday 5/17 for updated BP readings and to advise of labwork order  placed. Will recheck as needed after Monday.  Karren Cobble, PharmD, BCACP, [place] [address], [place], [place] [address] Phone: [phone]; Fax: [phone] 10/15/2019 2:45 PM

## 2019-10-15 NOTE — Patient Instructions (Addendum)
It was great meeting you today!  Your blood pressure has improved greatly.  Your goal is less than 130/80  Begin taking the losartan/hydrochlorothiazide 50/12.5 once daily in the morning  Remember to watch your salt intake and continue to exercise, at least 30 minutes a day 5 days a week  Call us with any questions and I will reach out to you on Monday   [facility] Phone: [phone]  10/15/2019 1:55 PM

## 2019-10-16 ENCOUNTER — Telehealth: Payer: Self-pay

## 2019-10-16 NOTE — Telephone Encounter (Signed)
-----   Message from Jake Bathe, MD sent at 10/16/2019  1:21 PM EDT ----- No coronary artery disease, excellent.  Stable 5mm pulmonary nodule. Since she is low risk, no ned to repeat scan.  Donato Schultz, MD

## 2019-10-16 NOTE — Telephone Encounter (Signed)
The patient has been notified of the CT result and verbalized understanding.  All questions (if any) were answered. Sigurd Sos, RN 10/16/2019 1:24 PM

## 2019-10-19 ENCOUNTER — Telehealth: Payer: Self-pay | Admitting: Pharmacist

## 2019-10-19 DIAGNOSIS — I10 Essential (primary) hypertension: Secondary | ICD-10-CM

## 2019-10-19 DIAGNOSIS — Z8249 Family history of ischemic heart disease and other diseases of the circulatory system: Secondary | ICD-10-CM

## 2019-10-19 MED ORDER — ROSUVASTATIN CALCIUM 5 MG PO TABS: 5.0000 mg | ORAL_TABLET | Freq: Every day | ORAL | 2 refills | Status: AC

## 2019-10-19 NOTE — Telephone Encounter (Signed)
Spoke with patient over phone to check BP readings.

## 2019-10-19 NOTE — Telephone Encounter (Signed)
BP on 5/14: 120/80 morning, 130/87 evening 5/15: 123/91 morning, 129/86 evening 5/16: 119/80 morning, 125/85 evening 5/17: 111/17 morning  Reports occasional lightheadedness over the weekend.  Advised this could be due to blood pressure decreasing and likely should be transient.    Due to elevated LDL and normal CT scan, will start crestor 5 mg once daily.  Advised patient to have bloodwork checked in 1 month after new medications.

## 2019-10-19 NOTE — Addendum Note (Signed)
Addended by: Cheree Ditto on: 10/19/2019 11:14 AM   Modules accepted: Orders

## 2019-10-30 ENCOUNTER — Telehealth: Payer: Self-pay | Admitting: Cardiology

## 2019-10-30 NOTE — Telephone Encounter (Signed)
BP 115/76 (88) and good hydration   Patient occasionally feels lightheaded, denies falls or issues with daily activities.   Patient will wait 1 more week to see if her body adjust to medication. May cut tables in 1/2 to take 1/2 tab in AM and 1/2 at noon.  Will call to follow up in 1 week.

## 2019-10-30 NOTE — Telephone Encounter (Signed)
New message   Pt c/o medication issue:  1. Name of Medication:   losartan-hydrochlorothiazide (HYZAAR) 50-12.5 MG tablet     2. How are you currently taking this medication (dosage and times per day)? As written  3. Are you having a reaction (difficulty breathing--STAT)? No   4. What is your medication issue? Patient has a side effect from medication patient feels lightheaded.

## 2019-11-12 NOTE — Telephone Encounter (Signed)
Error, please see other telephone encounter

## 2019-11-20 ENCOUNTER — Other Ambulatory Visit: Payer: Self-pay

## 2019-11-20 ENCOUNTER — Other Ambulatory Visit: Payer: PRIVATE HEALTH INSURANCE | Admitting: *Deleted

## 2019-11-20 ENCOUNTER — Other Ambulatory Visit: Payer: Self-pay | Admitting: *Deleted

## 2019-11-20 DIAGNOSIS — Z01812 Encounter for preprocedural laboratory examination: Secondary | ICD-10-CM

## 2019-11-21 LAB — BASIC METABOLIC PANEL
BUN/Creatinine Ratio: 17 (ref 9–23)
BUN: 14 mg/dL (ref 6–24)
CO2: 25 mmol/L (ref 20–29)
Calcium: 9.9 mg/dL (ref 8.7–10.2)
Chloride: 102 mmol/L (ref 96–106)
Creatinine, Ser: 0.83 mg/dL (ref 0.57–1.00)
GFR calc Af Amer: 95 mL/min/{1.73_m2} (ref >59)
GFR calc non Af Amer: 82 mL/min/{1.73_m2} (ref >59)
Glucose: 82 mg/dL (ref 65–99)
Potassium: 4.3 mmol/L (ref 3.5–5.2)
Sodium: 142 mmol/L (ref 134–144)

## 2020-01-06 ENCOUNTER — Other Ambulatory Visit: Payer: Self-pay | Admitting: Cardiology

## 2022-09-19 ENCOUNTER — Ambulatory Visit: Admit: 2022-09-19 | Discharge: 2022-09-19 | Payer: BLUE CROSS/BLUE SHIELD | Attending: Hematology & Oncology

## 2022-09-19 DIAGNOSIS — C50111 Malignant neoplasm of central portion of right female breast: Secondary | ICD-10-CM

## 2022-09-19 NOTE — Progress Notes (Signed)
Initial Consultation Note -- Hematology/ Medical Oncology    Patient Name: Angela Gray Attending:  Linward Foster, MD   DOB: 1971-03-28  Age:52 y.o. 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  She is a very nice 52 year old female who was referred here in the setting of a recent diagnosis of breast cancer.  She reports that many years ago she had some calcifications in the left breast that were biopsied and were benign.  She is kept up with her routine screening mammogram and in March she had a new 3 mm focal asymmetry in the right breast at 12:00.  This led to diagnostic workup and ultimately a biopsy on the ninth of this month that shows moderately differentiated invasive ductal carcinoma.  Prognostic showed that it was ER/PR positive and HER2 low.  Ki-67 was low as well.  She feels well in her usual state of health.  She meets with Dr. Cristi Loron this Friday to discuss surgical management.    Past Medical History       Past Medical History:   Diagnosis Date    Arthritis 10/03/2019    Breast cancer (HCC) 09/13/22    Cancer (HCC) 09/13/2022    Hyperlipidemia     Hypertension 09/18/2019        Past Surgical History     Past Surgical History:   Procedure Laterality Date    KNEE SURGERY Right     PARTIAL HYSTERECTOMY (CERVIX NOT REMOVED)          Family History     Family History   Problem Relation Age of Onset     Arthritis Mother     High Blood Pressure Mother     High Cholesterol Mother     Osteoarthritis Mother     Alcohol Abuse Father     Arthritis Father     Alcohol Abuse Maternal Grandfather     Prostate Cancer Maternal Grandfather     Alcohol Abuse Maternal Grandmother     Colon Cancer Maternal Grandmother     High Blood Pressure Maternal Grandmother     High Cholesterol Maternal Grandmother     Cancer Paternal Grandfather     Alcohol Abuse Maternal Uncle     Colon Cancer Maternal Uncle     Alcohol Abuse Sister     Cancer Paternal Aunt     Diabetes Paternal Uncle     Diabetes Paternal Cousin     Heart Attack Maternal Uncle     Substance Abuse Paternal Uncle     Substance Abuse Paternal Cousin     Substance Abuse Maternal Uncle         Social History     Social History     Tobacco Use   Smoking Status Never   Smokeless Tobacco Never  Social History     Substance and Sexual Activity   Alcohol Use Yes    Alcohol/week: 1.0 standard drink of alcohol    Types: 1 Drinks containing 0.5 oz of alcohol per week          Allergies     Allergies   Allergen Reactions    Aspirin Anaphylaxis, Hives, Itching and Shortness Of Breath    Ibuprofen Anaphylaxis, Hives, Itching and Shortness Of Breath       Review of Systems   As mentioned in the history of present illness. All remaining 10 point systems were reviewed and remain negative.     Medications     Current Outpatient Medications   Medication Sig Dispense Refill    losartan-hydroCHLOROthiazide (HYZAAR) 50-12.5 MG per tablet       Loratadine (CLARITIN PO)       rosuvastatin (CRESTOR) 20 MG tablet        No current facility-administered medications for this visit.       Physical Exam   BP 115/65   Pulse 77   Temp 97.5 F (36.4 C)   Ht 1.524 m (5')   Wt 79.7 kg (175 lb 11.2 oz)   BMI 34.31 kg/m      This is a 52 y.o. female patient in no acute distress.   She answers my questions appropriately.  She is awake, alert and oriented.   HEENT: Normal conjunctiva and lids.  Oropharynx unremarkable, Neck without cervical or supraclavicular adenopathy or mass.   Pulmonary: Symmetric expansion, no accessory muscle use, no distress  Cardiovascular:  no peripheral edema  Skin: no rash, no petechiae, no ecchymosis, no pallor  Musculoskeletal: normal muscle strength  Neurological: no focal deficit, normal gait, alert & oriented x 3       Labs/Imaging:   No results found for: "WBC", "WBCLT", "HGB", "HGBP", "HCT", "PHCT", "PLT", "MCV"    No results found for: "NA", "K", "CL", "CO2", "AGAP", "GLU", "BUN", "GFRAA", "CA", "TP", "ALB", "GLOB", "ALT"      Visit Diagnoses     1. Malignant neoplasm of central portion of right breast in female, estrogen receptor positive (HCC)          ASSESSMENT AND PLAN   52 year old female with a new diagnosis of breast cancer.  Based on mammogram and ultrasound this appears to be a T1 a lesion.  She would be a good candidate for breast conservation surgery but will discuss both options with Dr. Alfonso Ellis.  She is unsure about whether or not she would do adjuvant radiation.  I think it would be very helpful for her to meet with radiation oncology now to discuss this prior to making her surgical decision.  Given her personal history and family history think genetic testing may be helpful as well and could influence her surgical management.  Will get an [facility] gene panel today to help expedite this process.  We do lengthy discussion regarding her diagnosis and management which would include adjuvant endocrine therapy.  Hopefully if the surgical findings are concordant with what was seen on mammogram this would be T1a in 0 and we would not offer her chemotherapy.  Genomic assay may be considered if it is found to be of larger tumor or node positive.  I will plan to see her back in 3 weeks which may be following her surgical management but we can adjust this date forward or backward as needed.      80 minutes, > 50% of time  on counseling and/or coordination of care    Orders Placed This Encounter    MISCELLANEOUS SENDOUT     [facility] genetic testing    CBC with Auto Differential    Comprehensive Metabolic Panel    External Referral To Radiation Oncology     Referral Priority:   Routine     Referral Type:   Eval and Treat     Referral Reason:   Specialty Services Required     Requested Specialty:   Radiation Oncology     Number of Visits Requested:   1    losartan-hydroCHLOROthiazide (HYZAAR) 50-12.5 MG per tablet    Loratadine (CLARITIN PO)    rosuvastatin (CRESTOR) 20 MG tablet

## 2022-10-10 ENCOUNTER — Ambulatory Visit: Admit: 2022-10-10 | Discharge: 2022-10-10 | Payer: BLUE CROSS/BLUE SHIELD | Attending: Hematology & Oncology

## 2022-10-10 ENCOUNTER — Encounter: Payer: BLUE CROSS/BLUE SHIELD | Attending: Radiation Oncology

## 2022-10-10 DIAGNOSIS — C50311 Malignant neoplasm of lower-inner quadrant of right female breast: Secondary | ICD-10-CM

## 2022-10-10 DIAGNOSIS — C50111 Malignant neoplasm of central portion of right female breast: Secondary | ICD-10-CM

## 2022-10-10 DIAGNOSIS — Z170 Estrogen receptor positive status [ER+]: Secondary | ICD-10-CM

## 2022-10-10 NOTE — Progress Notes (Signed)
RADIATION ONCOLOGY INTAKE FORM    Angela Gray  July 19, 1970  [id number]      Radiation-specific history:  Prior radiation: no  If yes, when/where: n/a  Pacemaker, AICD or implantable medical devices (other than port): no  Post-menopausal: currently in menopause  Collagen vascular diseases (UC/Crohn's/scleroderma/lupus/RA): no  Interstitial lung disease: no  Prior cancer: no    PEG tube: no  Allergy to IV contrast: no  Have you received chemotherapy previously?: no  If yes, when was your last infusion: n/a  Have you ever smoked cigarettes?: no  Most recent flu vaccine: no  Most recent coronavirus vaccine: no    ROS:  Review of symptoms (problems bolded): vision, hearing, confusion, balance, weakness, numbness, lungs/breathing, sinuses/nose, bowel, eating, swallowing, digestion, urinary, skin    BP 124/85   Pulse 77   Resp 16   Ht 1.524 m (5')   Wt 79.2 kg (174 lb 9.6 oz)   SpO2 99%   BMI 34.10 kg/m   Pain score: 0/10/ intermittent right breast pain from biopsy site that comes and goes  PDS score: 7  PHQ4 score: 10    Female patients only:  Age at menses: 11  Birth control (past/present): past  If yes, age and duration: at 36 until age 20  G 70 P 3  Age at first pregnancy: 20  If post-menopausal, age: currently in menopause  Hormone replacement therapy: did the cream for 3 weeks and have stopped    Nursing notes: consult for right breast cancer. Pt is here with her daughter. Steady gait. No learning barriers. H&P, medications and allergies reviewed. Support and nutritional screening assessed. Referral will be sent to SW due to score on PDS. LBC evaluated patient. ASTRO pamphlet given for discussion reinforcement. No follow up needed to be scheduled. Pt aware to call with any questions or concerns.    Contact person: Kerney Elbe (spouse): [phone]      Personal Information  - Marital status: married  - Occupation: Regulatory affairs officer  - ECOG: 0    Past Medical History  Past Medical History:    Diagnosis Date    Arthritis 10/03/2019    Breast cancer (HCC) 09/13/22    Cancer (HCC) 09/13/2022    Hyperlipidemia     Hypertension 09/18/2019        Past Surgical history  Past Surgical History:   Procedure Laterality Date    KNEE SURGERY Right     PARTIAL HYSTERECTOMY (CERVIX NOT REMOVED)          Social History     Tobacco Use    Smoking status: Never    Smokeless tobacco: Never   Vaping Use    Vaping Use: Never used   Substance Use Topics    Alcohol use: Yes     Alcohol/week: 1.0 standard drink of alcohol     Types: 1 Drinks containing 0.5 oz of alcohol per week    Drug use: Never       Family History   Problem Relation Age of Onset    Arthritis Mother     High Blood Pressure Mother     High Cholesterol Mother     Osteoarthritis Mother     Alcohol Abuse Father     Arthritis Father     Alcohol Abuse Maternal Grandfather     Prostate Cancer Maternal Grandfather     Alcohol Abuse Maternal Grandmother     Colon Cancer Maternal Grandmother     High Blood Pressure Maternal Grandmother  High Cholesterol Maternal Grandmother     Cancer Paternal Grandfather     Alcohol Abuse Maternal Uncle     Colon Cancer Maternal Uncle     Alcohol Abuse Sister     Cancer Paternal Aunt     Diabetes Paternal Uncle     Diabetes Paternal Cousin     Heart Attack Maternal Uncle     Substance Abuse Paternal Uncle     Substance Abuse Paternal Cousin     Substance Abuse Maternal Uncle         Current Outpatient Medications   Medication Instructions    Loratadine (CLARITIN PO)     losartan-hydroCHLOROthiazide (HYZAAR) 50-12.5 MG per tablet     rosuvastatin (CRESTOR) 20 MG tablet     vitamin D (ERGOCALCIFEROL) 50,000 Units, Oral, WEEKLY        Allergies   Allergen Reactions    Aspirin Anaphylaxis, Hives, Itching and Shortness Of Breath    Ibuprofen Anaphylaxis, Hives, Itching and Shortness Of Breath

## 2022-10-10 NOTE — Progress Notes (Signed)
Initial Consultation Note -- Hematology/ Medical Oncology    Patient Name: Angela Gray Attending:  Linward Foster, MD   DOB: January 14, 1971  Age:52 y.o. 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 [facility] 48 gene panel 09/2022: VUS MUTYH, no pathologic mutation idenitfied      History of Present Illness   Angela Gray is here today for follow up.  She returns today for continued discussion regarding management of her breast cancer. She has met with general surgery and plastic surgery. She has also met with radiation oncology and has a second opinion regarding radiation. We reviewed her genetic testing results. Her Mri show no concerning findings.     Past Medical History       Past Medical History:   Diagnosis Date    Arthritis 10/03/2019    Breast cancer (HCC) 09/13/22    Cancer (HCC) 09/13/2022    Hyperlipidemia     Hypertension 09/18/2019        Past Surgical History     Past Surgical History:   Procedure Laterality Date    KNEE SURGERY Right     PARTIAL HYSTERECTOMY (CERVIX NOT REMOVED)          Family History     Family History   Problem Relation Age of Onset    Arthritis Mother     High Blood Pressure Mother     High Cholesterol Mother     Osteoarthritis Mother     Alcohol Abuse Father     Arthritis Father     Alcohol Abuse Maternal Grandfather      Prostate Cancer Maternal Grandfather     Alcohol Abuse Maternal Grandmother     Colon Cancer Maternal Grandmother     High Blood Pressure Maternal Grandmother     High Cholesterol Maternal Grandmother     Cancer Paternal Grandfather     Alcohol Abuse Maternal Uncle     Colon Cancer Maternal Uncle     Alcohol Abuse Sister     Cancer Paternal Aunt     Diabetes Paternal Uncle     Diabetes Paternal Cousin     Heart Attack Maternal Uncle     Substance Abuse Paternal Uncle     Substance Abuse Paternal Cousin     Substance Abuse Maternal Uncle         Social History     Social History     Tobacco Use   Smoking Status Never   Smokeless Tobacco Never      Social History     Substance and Sexual Activity   Alcohol Use Yes    Alcohol/week: 1.0 standard drink of alcohol    Types: 1 Drinks containing 0.5 oz of alcohol per week          Allergies  Allergies   Allergen Reactions    Aspirin Anaphylaxis, Hives, Itching and Shortness Of Breath    Ibuprofen Anaphylaxis, Hives, Itching and Shortness Of Breath       Review of Systems   As mentioned in the history of present illness. All remaining 10 point systems were reviewed and remain negative.     Medications     Current Outpatient Medications   Medication Sig Dispense Refill    losartan-hydroCHLOROthiazide (HYZAAR) 50-12.5 MG per tablet       Loratadine (CLARITIN PO)       rosuvastatin (CRESTOR) 20 MG tablet        No current facility-administered medications for this visit.       Physical Exam   BP 120/76   Pulse 77   Temp 96.8 F (36 C)   Ht 1.524 m (5')   Wt 79.2 kg (174 lb 11.2 oz)   BMI 34.12 kg/m      This is a 52 y.o. female patient in no acute distress.   She answers my questions appropriately.  She is awake, alert and oriented.   HEENT: Normal conjunctiva and lids. Oropharynx unremarkable, Neck without cervical or supraclavicular adenopathy or mass.   Pulmonary: Symmetric expansion, no accessory muscle use, no distress  Cardiovascular:  no peripheral edema  Skin:  no rash, no petechiae, no ecchymosis, no pallor  Musculoskeletal: normal muscle strength  Neurological: no focal deficit, normal gait, alert & oriented x 3       Labs/Imaging:     Lab Results   Component Value Date/Time    WBC 6.3 09/19/2022 09:25 AM    HGB 14.5 09/19/2022 09:25 AM    HCT 41.6 09/19/2022 09:25 AM    PLT 216 09/19/2022 09:25 AM    MCV 89 09/19/2022 09:25 AM       Lab Results   Component Value Date/Time    NA 139.7 09/19/2022 09:25 AM    K 4.28 09/19/2022 09:25 AM    CL 101.5 09/19/2022 09:25 AM    CO2 32.5 09/19/2022 09:25 AM    BUN 17.4 09/19/2022 09:25 AM    ALT 31 09/19/2022 09:25 AM         Visit Diagnoses     1. Malignant neoplasm of central portion of right breast in female, estrogen receptor positive (HCC)          ASSESSMENT AND PLAN   52 year old female with a new diagnosis of breast cancer.  Based on mammogram and ultrasound this appears to be a T1 a lesion.  MRI would strongly support this as well. She is still deciding between BCS or mastectomy with reconstruction. We discussed these again at length today. She is meeting with Claretha Cooper R/O today. I will plan to see her back in the post-op setting for review of path and discussion of appropriate adjuvant therapy.       30 minutes, > 50% of time on counseling and/or coordination of care   No orders of the defined types were placed in this encounter.

## 2022-10-10 NOTE — Consults (Signed)
[facility]                              CONSULTATION      PATIENT NAME:Angela Gray, Angela Gray     DOB:1971-02-01  MED REC ZO:[id number]                       ROOM:  ACCOUNT [id number]                       ADMIT DATE:10/10/2022  PROVIDER:Elianis Fischbach Warren Danes, MD    DATE OF SERVICE:  10/10/2022    DIAGNOSIS:  Invasive ductal carcinoma of right breast, stage cT1a N0.    HISTORY OF PRESENT ILLNESS:  The patient is a 52 year old white female, who was noted to have a new abnormality in the right breast on routine screening mammogram.  The area was not palpable.  There was no overlying skin change or nipple discharge.  Diagnostic imaging confirmed a spiculated mass in the 12 o'clock position, 6 cm from the nipple measuring 4 mm.  There were no suspicious axillary lymph nodes.    Biopsy on April 9th showed invasive ductal carcinoma, moderately differentiated.  Tumor was ER positive at 34%, PR positive at 29%, HER2 negative, and Ki-67 7%.    [facility] genetic testing showed variant of uncertain significance.  MRI of the breast at [facility] on April 18th showed no enhancing mass or lesion at the site of biopsy-proven malignancy in the right breast at 12 o'clock position.  There was no lymphadenopathy.  There were no areas of abnormality in the left breast.    The patient met with Dr. Linward Foster and several times with Dr. Doreene Adas.  She is considering treatment options.  She has a consultation appointment with Dr. Susanne Borders later this week and will follow up with Dr. Cristi Loron on Friday, May 10th.    The patient has done well since her biopsy.  She denies any signs of infection or upper extremity edema, respiratory complaints, or chest discomfort.  She has a history of benign biopsy of the left breast several years ago.  She has not taken hormonal replacement therapy other than for about a week.  She had just started a cream when she received this diagnosis and  discontinued this.  She is G5, P3 with 1st pregnancy at age 23.  She took birth control from age 51 through age 38.  Menses started at age 32.  She is menopausal currently.  No history of prior radiation or connective tissue disorders, prior benign breast biopsy.  No family history of breast cancer.    PAST MEDICAL HISTORY:  Arthritis.  Hyperlipidemia.  Hypertension.  Right breast cancer.    PAST SURGICAL HISTORY:  Right breast biopsy.  Benign left breast biopsy several years ago.  Right knee surgery.  Hysterectomy.    MEDICATIONS:  Hyzaar, Crestor, vitamin D.    ALLERGIES:  ASPIRIN AND IBUPROFEN AND NSAIDS.      SOCIAL HISTORY:  The patient has never smoked.  She drinks about 1 alcoholic beverage per week.  She has several children and is with her 50 year old daughter today.  She lived in [place], [place] up until a few years ago when she and her husband moved to the [place] area.  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    FAMILY HISTORY:  Maternal grandfather with prostate cancer.  Maternal grandmother with colon cancer.  Paternal grandfather with cancer of unknown type.  Maternal uncle with colon cancer.  Paternal aunt with cancer of unknown type.    REVIEW OF SYSTEMS:  Per history and chart review.  No recent fevers, chills, or weight loss.  No headaches or visual change.  She denies respiratory complaints, chest pain or cardiac symptoms.  No new focal bone pain.  She has had anxiety related to her diagnosis and at times make a decision regarding surgical treatment and adjuvant therapy.    PHYSICAL EXAMINATION:  Weight 174 pounds 9 ounces.  Height 5 feet.  VITAL SIGNS:  Stable.  Well-developed, well-nourished, white female.  Alert and oriented x3.  HEENT:  Shows sclerae nonicteric.  EOMI.  NECK:  No palpable lymphadenopathy of the head, neck, supraclavicular, or axillary regions.  No tenderness to palpation of  the spine or pelvic pain.  BREASTS:  Without palpable mass, skin retraction or dimpling, nipple inversion or discharge.  Right breast with punctate biopsy at site visible at the 12 o'clock position, right breast.  No sign of infection.  No edema of the breast.  EXTREMITIES:  No upper extremity edema.  ABDOMEN:  Soft and nontender.  NEUROLOGIC:  Speech and mentation normal.    IMPRESSION:  A 52 year old postmenopausal female with invasive ductal carcinoma of right breast, stage cT1a N0.    PLAN:  The patient is considering surgical options as well as thinking through adjuvant therapies.  She is considering mastectomy versus breast conservation therapy.  She met with Dr. Rich Brave, Radiation Oncology at [facility].  She has also discussed endocrine therapy with Dr. Minerva Fester.  She understands that final adjuvant therapies cannot be recommended until final pathology is reviewed.  She understands that radiation would be recommended if she has breast conservation surgery, but likely could be omitted with mastectomy.  She has set her early stage disease, however, I would not encourage her to consider mastectomy unless she is not comfortable with keeping breasts and when to undergo adjuvant therapy postoperatively.    I reviewed the indications, risks and benefits as well as expected and potential acute and late side effects of therapy.  I have reviewed the planning and treatment process.  We discussed late effects impacting cosmetic outcome.  We discussed partial breast radiation versus whole breast radiation.  I explained the difference between hypofractionation and standard fractionation and the concept of boosting.  I explained that she cannot be recommended for definitive adjuvant radiation plan until we know her final pathology, but likely she would be a candidate for whole breast radiation without coverage of nodal basins.  She very likely may be a candidate for partial breast radiation as well.   She understands that determination of eligibility for partial breast radiation not only depends on final pathology, but depends on her simulation scan and whether or not we could confidently walk on to the surgical bed for partial breast treatment.    The patient was given written information on radiotherapy as well as our contact information.  She has a consultation with Dr. Susanne Borders later this week and will follow up with Dr. Cristi Loron on Friday to hopefully finalize surgical plan.    The patient scored 7 on the PDS test and her PHQ-4 score was 10.  RN notified me of this and has made referral to a navigator  for assistance and resources.    CMS QUALITY METRICS:    1. Metric 110 :  Flu shot, declined.  2. Metric 130:  Medications documented.  3. Metric 143:  Pain is 0/10.  4. Metric 144:  Not applicable.  5. Metric 154:  Not high fall risk.  ECOG score 0.  6. Metric 156:  Normal tissue doses would be calculated at time of planning.   7. Metric 226:  Not applicable.    I spent greater than 80 minutes in review of imaging and records, evaluation of the patient with history and physical examination, counseling the patient regarding her disease and treatment options, post appointment documentation and coordination of care.        Elita Quick, MD      LBC/AQS  D:  10/10/2022 18:54:31  T:  10/10/2022 23:23:58  JOB #:  [id number]    CC:   Lois Huxley, MD        Dr. Iline Oven Sherie Don, MD        Jamesetta So, [place]

## 2022-10-12 ENCOUNTER — Encounter

## 2022-10-24 ENCOUNTER — Other Ambulatory Visit: Payer: BLUE CROSS/BLUE SHIELD

## 2022-10-26 ENCOUNTER — Other Ambulatory Visit: Payer: BLUE CROSS/BLUE SHIELD

## 2022-11-21 ENCOUNTER — Encounter: Attending: Hematology & Oncology

## 2022-11-26 ENCOUNTER — Ambulatory Visit: Admit: 2022-11-26 | Discharge: 2022-11-26 | Payer: BLUE CROSS/BLUE SHIELD | Attending: Hematology & Oncology

## 2022-11-26 ENCOUNTER — Encounter

## 2022-11-26 DIAGNOSIS — C50111 Malignant neoplasm of central portion of right female breast: Secondary | ICD-10-CM

## 2022-11-26 DIAGNOSIS — Z170 Estrogen receptor positive status [ER+]: Secondary | ICD-10-CM

## 2022-11-26 LAB — CBC WITH AUTO DIFFERENTIAL
Absolute Mid: 0.6 10*3/uL (ref 0.0–1.8)
Granulocyte Absolute Count: 4.5 10*3/uL (ref 2.0–7.8)
Granulocytes %: 68.3 % (ref 37.0–92.0)
Hematocrit: 44.3 % (ref 37.0–51.0)
Hemoglobin: 14.3 g/dL (ref 12.0–17.5)
Lymphocytes Absolute: 1.5 10*3/uL (ref 0.6–4.1)
Lymphocytes: 22.9 % (ref 10.0–58.5)
MCH: 31.6 pg (ref 26.0–32.0)
MCHC: 32.3 g/dL (ref 31.0–36.0)
MCV: 98.1 fL — ABNORMAL HIGH (ref 80.0–97.5)
MID %: 8.8 % (ref 0.1–24.0)
MPV: 7.4 fL (ref 0.0–49.9)
Platelets: 288 10*3/uL (ref 140–440)
RBC: 4.52 M/uL (ref 4.20–6.30)
RDW: 12.4 % (ref 11.5–14.5)
WBC: 6.6 10*3/uL (ref 4.1–10.9)

## 2022-11-26 LAB — COMPREHENSIVE METABOLIC PANEL
ALT: 63 U/L — ABNORMAL HIGH (ref 0–35)
AST: 37 U/L — ABNORMAL HIGH (ref 0–35)
Albumin/Globulin Ratio: 1.5 (ref 1.00–2.70)
Albumin: 4.5 g/dL (ref 3.5–5.2)
Alk Phosphatase: 95 U/L (ref 35–117)
Anion Gap: 10 mmol/L (ref 2–17)
BUN: 16 mg/dL (ref 6–20)
CO2: 25 mmol/L (ref 22–29)
Calcium: 9.6 mg/dL (ref 8.5–10.7)
Chloride: 99 mmol/L (ref 98–107)
Creatinine: 0.7 mg/dL (ref 0.5–1.0)
Est, Glom Filt Rate: 103 mL/min/1.73m (ref >=60)
Globulin: 3 g/dL (ref 1.9–4.4)
Glucose: 107 mg/dL — ABNORMAL HIGH (ref 70–99)
Osmolaliy Calculated: 268 mOsm/kg — ABNORMAL LOW (ref 270–287)
Potassium: 3.9 mmol/L (ref 3.5–5.3)
Sodium: 133 mmol/L — ABNORMAL LOW (ref 135–145)
Total Bilirubin: 0.2 mg/dL (ref 0.00–1.20)
Total Protein: 7.5 g/dL (ref 5.7–8.3)

## 2022-11-27 NOTE — Progress Notes (Unsigned)
[facility]   Artist Pais. Rhae Lerner, MD  Peter Minium, MD  Cena Benton, MD  Marcelyn Bruins. Burbridge, DO  Orpah Melter, MD  Stoney Bang, MD   [facility], ACNP-BC  Arman Bogus. 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  Disease was centered at the 12 o'clock position, 5 cm from the nipple.      Pathologic staging was performed based on biopsy given no residual disease at time of surgery.  Original biopsy was performed at [facility] on 09/11/2022.       TREATMENT INTENT:  Curative.      CURRENT DISEASE STATUS:  NED.    ECOG PERFORMANCE STATUS:  0.    PAIN SCORE:  0.    TOBACCO USE:  None.    MENOPAUSE STATUS:  Postmenopausal.      GENETIC TESTING:  Invitae 48 gene panel, VUS, MUTHYH.  No pathologic mutation identified.      CHIEF COMPLAINT:  "Breast cancer."    HISTORY OF PRESENT ILLNESS:  Ms. Librizzi is a delightful postmenopausal 52 year old woman.  Her oncologic history dates back to a screening mammogram on 08/14/2022 demonstrating a 3 mm focal asymmetry in the right breast at the 12 o'clock position.  Subsequently diagnostic mammogram and ultrasound were performed on the 08/30/2022 confirming a spiculated mass at the 12 o'clock position, confirmed by ultrasound with 4 mm area of shadowing.  Subsequent biopsy performed on 09/11/2022 demonstrating moderately differentiated invasive ductal carcinoma with ER 34%, PR 29%, HER2 was 1+.  Ki-67 was 7%.  In that context, she was seen by Dr. Cristi Loron and underwent bilateral breast MRI on 10/02/2022 demonstrating no enhancing mass  lesions seen at the site of biopsy-proven malignancy.  There was no suspicious lymphadenopathy.  After negative genetic testing, she was taken to the operating room on 10/31/2022 for a right lumpectomy.  She had 3 lymph nodes resected, all negative.  There was no evidence of residual invasive carcinoma.  Pathologic staging was made from biopsy sample measuring 5 mm for pathologic staging of pT1a pN0.      She is here to discuss the next steps in management.  She is here for a 2nd opinion.  She denies any fevers, chills, night sweats, chest pain, shortness of breath, nausea, vomiting, constipation, loose stools, melena, or hematochezia. Energy level is good.  She is otherwise at her baseline.  She has no new complaints and feels well.  She was seen by Dr. Mat Carne and the plan is to consider radiation therapy in the adjuvant setting.  Having said that, final decision was not made as Dr. Mat Carne did not have pathology available. Mr. Denisco is otherwise well.  She denies any B-symptoms.  She is otherwise in her usual state of health.    PAST MEDICAL HISTORY:  Osteoarthritis.    Hyperlipidemia.  Hypertension.  Breast cancer as above.     PAST SURGICAL HISTORY:  Simple hysterectomy.  Right breast bipsy.  Right knee surgery.       MEDICATIONS:  Losartan.  Rosuvastatin.  Claritin.  Vitamin D.  ALLERGIES:  Ibuprofen and aspirin cause anaphylaxis.      SOCIAL HISTORY:  She denies any tobacco use or illicit drugs.  She does drink social with less than 1-2 drinks per week.  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      FAMILY HISTORY:    Paternal uncle with colon cancer; maternal aunt with uterine cancer; paternal grandfather with gastric cancer; maternal grandfather with prostate cancer; maternal grandmother with colon cancer; great aunt with leukemia.    GYN HISTORY:    G5, P3.  Menarche age 27 or 68.  Age of first live birth was 23.  She was postmenopausal.  She is status post simple hysterectomy age 15.   She breastfed each of her 3 children for about 1-3 months.  She used Premarin vaginal cream given vaginal dryness for about a month but has discontinued this.       REVIEW OF SYSTEMS:   Review of Systems is otherwise per interval history.     PHYSICAL EXAM:  Vital Signs:  BP 137/83 (Site: Left Upper Arm, Position: Sitting, Cuff Size: Medium Adult)   Pulse 81   Temp 97.7 F (36.5 C) (Oral)   Resp 16   Ht 1.505 m (4' 11.25")   Wt 79.8 kg (176 lb)   BMI 35.25 kg/m     GENERAL: This is a delightful female in no acute distress, answering questions appropriately   HEENT: Mucous membranes are moist.    LUNGS: Demonstrate symmetric thoracic extension.     CARDIOVASCULAR: S1 and S2 that is regular.    ABDOMEN:  Soft, nondistended.    EXTREMITIES: Demonstrate no visible edema.     LABS:  Hematology:  Lab Results   Component Value Date    WBC 6.6 11/26/2022    RBC 4.52 11/26/2022    HGB 14.3 11/26/2022    HCT 44.3 11/26/2022    MCV 98.1 (H) 11/26/2022    MCH 31.6 11/26/2022    MCHC 32.3 11/26/2022    RDW 12.4 11/26/2022    PLT 288 11/26/2022    MPV 7.4 11/26/2022    BASOPCT 0.3 09/19/2022    MONOPCT 8.8 09/19/2022    EOSABS 0.1 09/19/2022    BASOSABS 0.0 09/19/2022    LYMPHSABS 1.3 09/19/2022    MONOSABS 0.6 09/19/2022     Chemistry:  Lab Results   Component Value Date    NA 133 (L) 11/26/2022    K 3.9 11/26/2022    CL 99 11/26/2022    CO2 25 11/26/2022    BUN 16 11/26/2022    CREATININE 0.7 11/26/2022    GLUCOSE 107 (H) 11/26/2022    CALCIUM 9.6 11/26/2022    BILITOT 0.20 11/26/2022    ALKPHOS 95 11/26/2022    AST 37 (H) 11/26/2022    ALT 63 (H) 11/26/2022    LABGLOM 103 11/26/2022    GLOB 3.0 11/26/2022     IMAGING:  Reviewed and as above.    PATHOLOGY:  Reviewed and as above.    IMPRESSION:  Ms. Butala is a delightful young woman with very little in terms of past medical history presenting in the context of a T1N0 invasive ductal carcinoma.  We had an extended discussion outlining the role of ER/PR, HER2 and  the pathogenesis and treatment of disease, specifically outlining the role of systemic therapy is to reduce the risk of micrometastatic disease and thereby the development of stage IV cancer.   I explained that reassuringly given T1a lesion  with no residual disease at time of surgery after biopsy, the likelihood of micrometastatic disease is quite low.  Having said that, the recommendation would still be to consider moving forward with AI versus tamoxifen depending on bone density and current medications.  We will discuss the merits, benefits and toxicity profile in greater detail after she has decided on whether to move forward with radiation after her next visit with Dr. Mat Carne.  She is quite agreeable to this.      We discussed potential toxicity including hot flashes, vaginal dryness, arthralgias, myalgias, fatigue, mood changes, and metabolism change leading to weight gain in detail with AI's and tamoxifen.  I reassured her that we had several options on how to mitigate the effects of estrogen deprivation both metabolic, psychiatric, and sexual side effects.    RECOMMENDATIONS:    Proceed with radiation evaluation.  Return to clinic in 4-6 weeks to finalize treatment plans as it pertains to endocrine therapy in the adjuvant setting.  Proceed with DEXA scan.  Institution of vitamin D3 2000 units daily.  The patient knows to call with any questions or issues as they arise.    Thank you for allowing me to participate in the care of this very pleasant woman.            Peter Minium, MD  Hematology/Medical Oncology

## 2022-11-29 NOTE — Telephone Encounter (Signed)
Oncology Social Work Consult Note    SECTION I  Angela Gray  24-Aug-1970  Sex: female  Diagnosis: breast cancer    SECTION II   Reason for consult:  []  Housing []  Food []  Financial []  Transportation []  Safety concerns []  Community resources [x]  Mental health []  Substance abuse [] Other:    SUMMARY  MSW received voicemail from patient requesting MSW assistance. Able to reach patient via phone on return call.    Patient shares an interest in engaging in counseling for emotional support as she looks towards radiation and ongoing treatment, following recent surgery. She shares having had counseling many years ago and had a good experience. She feels it will be important for her to have space to process how she is feeling re: diagnosis and treatment.     She also shared interest in peer support/support groups, noting it would be helpful to talk with those with similar/shared experience. 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 She would prefer the option for in-person counseling but is open to telehealth if needed. No major barriers (e.g transportation) to attending appointments at this time.    Patient denies any other acute concerns. She reports stable housing, lives with her husband and 43- year old son. Her older son and daughter also live in [place], all are adult children. 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 She reports maintaining work while processing her diagnosis and treatment plan has been an additional stressor.     SECTION III    INTERVENTION & PLAN   Provided patient with: mental health resources  MSW offered to send counseling information for practices near her home, accepting her insurance, offering in-person appointments. MSW reviewed that list being sent is not exhaustive, can review additional options if needed. Patient very receptive to this.  MSW also provided information for local Breast  Cancer Support group along with [facility] for peer/survivor support  MSW also discussed the role of ONN for additional support and guidance as patient initiates radiation and ongoing treatment. Patient receptive to this. Henrietta Dine Compton notified.    Patient shared preference to receive this information via MyChart    Plan will be for follow up with patient as needed    Provided patient with SW contact information if additional psychosocial needs arise in the meantime.     [name], LMSW, CCM  Oncology Social Worker  [facility]  Ph: [phone]

## 2022-12-03 NOTE — Telephone Encounter (Signed)
Navigator reached out to pt. by phone to introduce navigation and assess for any needs. Pt. was appreciative of the call and discussed her breast cancer journey thus far. The pt. is currently pleased and is healing well from surgery and comfortable with all of her providers and care. The pt. did express continued anxiety form her cancer diagnosis and is researching some therapy options that were proved by social work. The pt.also had some questions about radiation and the navigator answered to the best of my ability and also called radiation oncology to help schedule her back for her a follow up  appointment. The pt. was very thankful for all the assistance and will continue to contact navigation and social work for any needs. Will continue to follow.

## 2022-12-12 ENCOUNTER — Encounter: Payer: BLUE CROSS/BLUE SHIELD | Attending: Radiation Oncology

## 2022-12-12 ENCOUNTER — Inpatient Hospital Stay: Admit: 2022-12-21 | Payer: BLUE CROSS/BLUE SHIELD

## 2022-12-12 DIAGNOSIS — Z08 Encounter for follow-up examination after completed treatment for malignant neoplasm: Secondary | ICD-10-CM

## 2022-12-12 DIAGNOSIS — C50311 Malignant neoplasm of lower-inner quadrant of right female breast: Principal | ICD-10-CM

## 2022-12-12 NOTE — Progress Notes (Signed)
Follow up for right breast cancer. Pt is here alone. Steady gait. H&P, medications and allergies reviewed. Pt denies any pain. Pt had her surgery on 10/24/22. LBC evaluated patient. Pt is scheduled for CT SIM on Monday at [facility]. Pt aware to call with any questions or concerns.

## 2022-12-12 NOTE — Progress Notes (Signed)
[facility]                           ONCOLOGY FOLLOW-UP      PATIENT NAME:Angela Gray, Angela Gray     DOB:[birth year]  MED REC NI:[id number]                       ROOM:  ACCOUNT [id number]                       ADMIT DATE:12/12/2022  PROVIDER:Yuktha Kerchner Warren Danes, MD    DATE OF SERVICE:  12/12/2022    DIAGNOSIS:  Invasive ductal carcinoma of right breast, stage pT1a N0.  Moderately differentiated and receptor positive.    HISTORY:  The patient is a 52 year old white female who returns today for re-evaluation after having surgery for right breast cancer.  I initially saw her in consultation on Oct 10, 2022.  At that point, she has had biopsy of the right breast after having abnormal routine screening mammogram.  That pathology showed invasive ductal carcinoma, moderately differentiated.  Tumor was ER positive at 34% and PR positive at 29% with Ki-67 7%.  HER2 was negative.  Tumor measured 5 mm in greatest dimension on a biopsy and there was no evidence of vascular invasion on the biopsy.  At the time of my consultation with the patient, she was considering surgical options.    The patient elected for breast conservation therapy.  She had lumpectomy with sentinel node biopsy and breast reduction surgery, had same procedure on Oct 24, 2022 at [facility].  Final pathology was negative for any residual invasive ductal carcinoma and therefore her stage was based on her biopsy specimen.  Three lymph nodes sampled were all negative.  Dr. Cristi Loron took additional margins specimens around the entire cavity and all were negative.  The tissue that Dr. Clide Cliff resected for reduction mammaplasty was benign.    The patient has done well postoperatively.  She has not had any signs of infection or upper extremity edema or respiratory complaints.  She met with Dr. Cephas Darby on June 24 and discussed endocrine therapy.  She will follow up with him after radiation.    The patient is hoping to plan a  trip in September, but will wait until she knows her radiation schedule before committing to travel.    PHYSICAL EXAMINATION:  VITAL SIGNS:  Weight 176.2 pounds.  Vital signs stable.  GENERAL:  Well appearing.  LYMPHATICS:  No palpable lymphadenopathy of the head, neck, supraclavicular, or axillary regions.  LUNGS:  Clear.  CARDIAC:  Regular rate and rhythm.  MUSCULOSKELETAL:  No tenderness to palpation of the spine or pelvic bones.  EXTREMITIES:  No upper extremity edema.  BREASTS:  Without palpable mass, skin retraction or dimpling, nipple inversion, or discharge.  Bilateral breast reduction incisions well healed.    IMPRESSION:  Invasive ductal carcinoma of right breast, stage pT1a N0.    PLAN:  I reviewed the indications, risks and benefits as well as acute and long-term complications of radiotherapy in this setting.  I explained the planning and treatment process.  I explained why she is not a candidate for partial breast radiation given the breast reduction surgery and expected uncertainty with being able to reliably identify the lumpectomy cavity.  I have recommended whole breast radiation without boosting.  Boosting is not indicated given that she had no residual disease at time of  lumpectomy and then had additional margins excised around the entire cavity.    The patient is anxious to proceed as recommended.  She does plan on taking endocrine therapy after radiation.  She understands that mastectomy is alternative in which case she would not require radiation.  She also understands indications in older women and with low risk disease that may forego radiation, but given her young age, she does not meet these criteria.  She will be scheduled for simulation promptly.  She was given ASTRO pamphlets at her last visit and has a good understanding of all these issues.  She understands risk of impact on cosmetic outcome over time.  She was just cleared by Dr. Doreene Adas to return for re-evaluation and  simulation to begin treatment and is already at 54 weeks postoperatively.  We will get her simulated and begin treatment as soon as possible, but we will not be able to meet the recommendation for beginning radiation within 2 months of surgery.        Elita Quick, MD      LBC/AQS  D:  12/12/2022 18:15:26  T:  12/12/2022 19:28:16  JOB #:  [id number]    CC:   Jamesetta So, DO        Peter Minium, MD        Tilden Dome, NP

## 2022-12-17 ENCOUNTER — Inpatient Hospital Stay: Admit: 2022-12-17 | Discharge: 2022-12-17 | Payer: BLUE CROSS/BLUE SHIELD

## 2022-12-20 ENCOUNTER — Inpatient Hospital Stay: Admit: 2022-12-21 | Discharge: 2023-01-14 | Payer: BLUE CROSS/BLUE SHIELD

## 2022-12-24 ENCOUNTER — Inpatient Hospital Stay: Admit: 2022-12-25 | Discharge: 2023-01-14 | Payer: BLUE CROSS/BLUE SHIELD

## 2023-01-02 NOTE — Telephone Encounter (Signed)
Navigator called the pt. to follow up regarding her radiation treatment. The pt. stated that her first radiation date was today and she would have approximately 16 fractions at [facility]. The pt. will continue to work and fit the radiation into her schedule. The pt. was nervous about radiation and the navigator spent some time discussing the process with the pt. The pt. verbalized understanding and was appreciative of the call and had no other questions or barriers at this time. Navigator will continue to provide support

## 2023-01-03 ENCOUNTER — Inpatient Hospital Stay: Admit: 2023-01-03 | Discharge: 2023-01-03 | Payer: BLUE CROSS/BLUE SHIELD

## 2023-01-03 ENCOUNTER — Inpatient Hospital Stay: Admit: 2023-01-03 | Discharge: 2023-01-03 | Payer: BLUE CROSS/BLUE SHIELD | Attending: Radiation Oncology

## 2023-01-03 NOTE — Unmapped (Signed)
RADIATION TREATMENT MANAGEMENT NOTE     Encounter Date: 01/03/2023  Patient Name: Angela Gray  Medical Record Number: [id number]    DIAGNOSIS:  Invasive ductal Ca of left breast, pT1a N0.  Moderately differentiated.  Receptor positive.     TREATMENT SITE:  Right breast.    RADIATION DOSE/FX:  42.4 Gy in 16 fractions.  No boost.    ASSESSMENT:     Fraction #: 1 / 16   Chemotherapy/systemic therapy:  no  ED visit/admission in past week:  no      RECOMMENDATIONS:  Plan for Therapy: continue treatment as planned  Skin care.    SUBJECTIVE: The patient denies having any issues. Just started treatment. States she is using lotions multiple times daily around treatment site. /JFD  Right shoulder discomfort since bx and surgery.  Doing exercises per Dr. Voncille Lo and will see her again in few weeks.  Cristi Loron said if not better by then then would refer to PT so I will not refer now.       PHYSICAL EXAM:  Vital Signs for this encounter:  BP 137/84   Pulse 88   Resp 16   Ht 1.524 m (5')   Wt 80.3 kg (177 lb 1.6 oz)   SpO2 100%   BMI 34.59 kg/m   Pain score: 0 / 10  Last weight:    Wt Readings from Last 4 Encounters:   01/03/23 80.3 kg (177 lb 1.6 oz)   12/12/22 79.9 kg (176 lb 3.2 oz)   11/26/22 79.8 kg (176 lb)   10/10/22 79.2 kg (174 lb 9.6 oz)     General:  Alert and Orientated X 3.  No acute distress.  Good ROM right up. Ext.  No skin rx.  S/P breast reduction.      JFD, RN    Doreatha Martin, MD  [facility]  Department of Radiation Oncology

## 2023-01-04 ENCOUNTER — Inpatient Hospital Stay: Admit: 2023-01-04 | Discharge: 2023-01-04 | Payer: BLUE CROSS/BLUE SHIELD

## 2023-01-07 ENCOUNTER — Inpatient Hospital Stay: Admit: 2023-01-07 | Discharge: 2023-01-07 | Payer: BLUE CROSS/BLUE SHIELD

## 2023-01-10 ENCOUNTER — Inpatient Hospital Stay: Admit: 2023-01-10 | Discharge: 2023-01-10 | Payer: BLUE CROSS/BLUE SHIELD | Attending: Radiation Oncology

## 2023-01-10 ENCOUNTER — Inpatient Hospital Stay: Admit: 2023-01-10 | Discharge: 2023-01-10 | Payer: BLUE CROSS/BLUE SHIELD

## 2023-01-10 NOTE — Unmapped (Signed)
RADIATION TREATMENT MANAGEMENT NOTE     Encounter Date: 01/10/2023  Patient Name: Angela Gray  Medical Record Number: [id number]    DIAGNOSIS:  Invasive ductal Ca of left breast, pT1a N0.  Moderately differentiated.  Receptor positive.     TREATMENT SITE:  Right breast.    RADIATION DOSE/FX:  42.4 Gy in 16 fractions.  No boost.    ASSESSMENT:     Fraction #: 3 / 16   Chemotherapy/systemic therapy:  no  ED visit/admission in past week:  no      RECOMMENDATIONS:  Plan for Therapy: continue treatment as planned  Skin care.    SUBJECTIVE: The patient reports pain ("zingers") 3/10 in treatment area and rates fatigue 5/10. She said she has redness the first day but that has since resolved. She is staying hydrated, trying to increase protein intake and moisturizing. /nt  No skin rx since first day some mild red.  Using utterly Smooth or aquaphor.      PHYSICAL EXAM:  Vital Signs for this encounter:  BP 114/77   Pulse 96   Resp 12   Wt 80.7 kg (177 lb 14.4 oz)   SpO2 98%   BMI 34.74 kg/m   Pain score: 3 / 10  Last weight:    Wt Readings from Last 4 Encounters:   01/10/23 80.7 kg (177 lb 14.4 oz)   01/03/23 80.3 kg (177 lb 1.6 oz)   12/12/22 79.9 kg (176 lb 3.2 oz)   11/26/22 79.8 kg (176 lb)     General:  Alert and Orientated X 3.  No acute distress.  Good ROM right up. Ext.  No skin rx.  S/P breast reduction.      NT, RN    Doreatha Martin, MD  [facility]  Department of Radiation Oncology

## 2023-01-11 ENCOUNTER — Inpatient Hospital Stay: Admit: 2023-01-11 | Discharge: 2023-01-11 | Payer: BLUE CROSS/BLUE SHIELD

## 2023-01-12 ENCOUNTER — Inpatient Hospital Stay: Admit: 2023-01-12 | Discharge: 2023-01-12 | Payer: BLUE CROSS/BLUE SHIELD

## 2023-01-15 ENCOUNTER — Inpatient Hospital Stay: Admit: 2023-01-15 | Discharge: 2023-01-15 | Payer: BLUE CROSS/BLUE SHIELD | Attending: Radiation Oncology

## 2023-01-15 ENCOUNTER — Inpatient Hospital Stay: Admit: 2023-01-15 | Discharge: 2023-01-15 | Payer: BLUE CROSS/BLUE SHIELD

## 2023-01-15 NOTE — Unmapped (Signed)
RADIATION TREATMENT MANAGEMENT NOTE     Encounter Date: 01/15/2023  Patient Name: Angela Gray  Medical Record Number: [id number]    DIAGNOSIS:  Invasive ductal Ca of left breast, pT1a N0.  Moderately differentiated.  Receptor positive.     TREATMENT SITE:  Right breast.    RADIATION DOSE/FX:  42.4 Gy in 16 fractions.  No boost.    ASSESSMENT:     Fraction #: 8 / 16   Chemotherapy/systemic therapy:  no  ED visit/admission in past week:  no      RECOMMENDATIONS:  Plan for Therapy: continue treatment as planned  Skin care.    SUBJECTIVE: The patient states has some intermittent tenderness at times. She states fatigue is 5/10 today. She states skin is a little red but intact. She uses udderly smooth cream 2 times a day and aquaphor at night./bg  Tolerating well.  OK for motrin prn.      PHYSICAL EXAM:  Vital Signs for this encounter:  BP 112/75   Pulse 69   Resp 16   Wt 80.5 kg (177 lb 6.4 oz)   SpO2 99%   BMI 34.65 kg/m   Pain score: 0 / 10  Last weight:    Wt Readings from Last 4 Encounters:   01/15/23 80.5 kg (177 lb 6.4 oz)   01/10/23 80.7 kg (177 lb 14.4 oz)   01/03/23 80.3 kg (177 lb 1.6 oz)   12/12/22 79.9 kg (176 lb 3.2 oz)     General:  Alert and Orientated X 3.  No acute distress.  Good ROM right up. Ext.  Mild erythema.  S/P breast reduction.      BG, RN    Doreatha Martin, MD  [facility]  Department of Radiation Oncology

## 2023-01-15 NOTE — Progress Notes (Signed)
Navigator met the pt. at her radiation appointment at [facility]. The pt. stated that she was doing well and was half way through her radiation treatment. The pt.mentioned that she was feeling better emotionally and currently has a Office manager for therapy. The pt. was in good spirits and denied any other navigational needs at this time. The navigator encouraged the pt. to call with any other concerns and she has her follow up scheduled with medical oncology after her radiation treatments are finished in a few weeks. Navigator will continue to follow for support.

## 2023-01-16 ENCOUNTER — Inpatient Hospital Stay: Admit: 2023-01-16 | Discharge: 2023-01-16 | Payer: BLUE CROSS/BLUE SHIELD

## 2023-01-17 ENCOUNTER — Inpatient Hospital Stay: Admit: 2023-01-17 | Discharge: 2023-01-17 | Payer: BLUE CROSS/BLUE SHIELD

## 2023-01-21 ENCOUNTER — Inpatient Hospital Stay: Admit: 2023-01-21 | Discharge: 2023-01-21 | Payer: BLUE CROSS/BLUE SHIELD

## 2023-01-22 ENCOUNTER — Encounter: Payer: BLUE CROSS/BLUE SHIELD | Attending: Radiation Oncology

## 2023-01-22 NOTE — Unmapped (Signed)
RADIATION TREATMENT MANAGEMENT NOTE     Encounter Date: 01/22/2023  Patient Name: Angela Gray  Medical Record Number: [id number]    DIAGNOSIS:  Invasive ductal Ca of left breast, pT1a N0.  Moderately differentiated.  Receptor positive.     TREATMENT SITE:  Right breast.    RADIATION DOSE/FX:  42.4 Gy in 16 fractions.  No boost.    ASSESSMENT:     Fraction #: 13 / 16   Chemotherapy/systemic therapy:  no  ED visit/admission in past week:  no      RECOMMENDATIONS:  Plan for Therapy: continue treatment as planned  Skin care.  Aleve prn.  F/U one month or sooner if any concerns.    SUBJECTIVE: The patient reports pain of 5/10. She didn't sleep much last night due to pain and could not get comfortable. She reports skin is "angry." No other issues to report. /nt  Up in night and moved to different position and eased the discomfort.  Took aleve and helped.      PHYSICAL EXAM:  Vital Signs for this encounter:  BP 115/79   Pulse 75   Temp 98 F (36.7 C) (Oral)   Resp 12   Wt 80.1 kg (176 lb 9.6 oz)   SpO2 100%   BMI 34.49 kg/m   Pain score: 5 / 10  Last weight:    Wt Readings from Last 4 Encounters:   01/22/23 80.1 kg (176 lb 9.6 oz)   01/15/23 80.5 kg (177 lb 6.4 oz)   01/10/23 80.7 kg (177 lb 14.4 oz)   01/03/23 80.3 kg (177 lb 1.6 oz)     General:  Alert and Orientated X 3.  No acute distress.  Good ROM right up. Ext.  Mild erythema more pronounced central and RLQ.  No desquam.  S/P breast reduction.      NT, RN    Doreatha Martin, MD  [facility]  Department of Radiation Oncology

## 2023-01-23 NOTE — Telephone Encounter (Signed)
Faxed clearance letter.

## 2023-01-23 NOTE — Telephone Encounter (Signed)
Fax dated 01/18/23 received from [facility], Dr Andrez Grime requesting Letter of clearance, least clinical note, and any testing available. Patient is to have a right knee arthroscopy, meniscectomy medial and lateral    Fax letter, clinical notes and test results to Darl Pikes, [phone]. Her phone # [phone]    Fax scanned into Media

## 2023-01-24 ENCOUNTER — Inpatient Hospital Stay: Admit: 2023-01-24 | Discharge: 2023-01-24 | Payer: BLUE CROSS/BLUE SHIELD

## 2023-01-29 ENCOUNTER — Ambulatory Visit: Admit: 2023-01-29 | Discharge: 2023-01-29 | Payer: BLUE CROSS/BLUE SHIELD | Attending: Nurse Practitioner

## 2023-01-29 ENCOUNTER — Other Ambulatory Visit: Admit: 2023-01-29 | Discharge: 2023-01-29 | Payer: BLUE CROSS/BLUE SHIELD

## 2023-01-29 ENCOUNTER — Encounter: Payer: BLUE CROSS/BLUE SHIELD | Attending: Radiation Oncology

## 2023-01-29 VITALS — BP 110/72 | HR 76 | Temp 98.30000°F | Resp 17 | Wt 178.0 lb

## 2023-01-29 DIAGNOSIS — C50111 Malignant neoplasm of central portion of right female breast: Secondary | ICD-10-CM

## 2023-01-29 LAB — CBC WITH AUTO DIFFERENTIAL
Absolute Mid: 0.5 10*3/uL (ref 0.0–1.8)
Granulocyte Absolute Count: 4 10*3/uL (ref 2.0–7.8)
Granulocytes %: 73.4 % (ref 37.0–92.0)
Hematocrit: 43.3 % (ref 37.0–51.0)
Hemoglobin: 14.5 g/dL (ref 12.0–17.5)
Lymphocytes Absolute: 0.9 10*3/uL (ref 0.6–4.1)
Lymphocytes: 16.8 % (ref 10.0–58.5)
MCH: 32.2 pg — ABNORMAL HIGH (ref 26.0–32.0)
MCHC: 33.5 g/dL (ref 31.0–36.0)
MCV: 96.2 fL (ref 80.0–97.5)
MID %: 9.8 % (ref 0.1–24.0)
MPV: 7.4 fL (ref 0.0–49.9)
Platelets: 231 10*3/uL (ref 140–440)
RBC: 4.5 M/uL (ref 4.20–6.30)
RDW: 12.2 % (ref 11.5–14.5)
WBC: 5.4 10*3/uL (ref 4.1–10.9)

## 2023-01-29 LAB — COMPREHENSIVE METABOLIC PANEL
ALT: 36 U/L — ABNORMAL HIGH (ref 0–35)
AST: 26 U/L (ref 0–35)
Albumin/Globulin Ratio: 1.46 (ref 1.00–2.70)
Albumin: 4.4 g/dL (ref 3.5–5.2)
Alk Phosphatase: 74 U/L (ref 35–117)
Anion Gap: 10 mmol/L (ref 2–17)
BUN: 18 mg/dL (ref 6–20)
CO2: 26 mmol/L (ref 22–29)
Calcium: 9.8 mg/dL (ref 8.5–10.7)
Chloride: 103 mmol/L (ref 98–107)
Creatinine: 0.7 mg/dL (ref 0.5–1.0)
Est, Glom Filt Rate: 103 mL/min/1.73mÃ¯Â¿Â½ (ref >=60)
Globulin: 3 g/dL (ref 1.9–4.4)
Glucose: 91 mg/dL (ref 70–99)
Osmolaliy Calculated: 279 mosm/kg (ref 270–287)
Potassium: 5.2 mmol/L (ref 3.5–5.3)
Sodium: 139 mmol/L (ref 135–145)
Total Bilirubin: 0.41 mg/dL (ref 0.00–1.20)
Total Protein: 7.5 g/dL (ref 5.7–8.3)

## 2023-01-29 MED ORDER — LETROZOLE 2.5 MG PO TABS
2.5 MG | ORAL_TABLET | Freq: Every day | ORAL | 3 refills | Status: DC
Start: 2023-01-29 — End: 2023-09-17

## 2023-01-29 NOTE — Progress Notes (Signed)
 [facility]   Donnice LABOR. Inis, MD  Jennet Man, MD  Bernardino RONAL Hurdle, MD  Oneil DASEN. Burbridge, DO  Randall Ernst, MD  Rollene Cleaves, MD   [facility], ACNP-BC  Mylinda DASEN. 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  Disease was centered at the 12 o'clock position, 5 cm from the nipple.      Pathologic staging was performed based on biopsy given no residual disease at time of surgery.  Original biopsy was performed at [facility] on 09/11/2022.       TREATMENT:   10/24/2022 Lumpectomy and sentinel lymph node biopsy.  01/25/2023 Adjuvant radiation complete with Dr Borrow.   02/2023 planned initiation of adjuvant letrozole .     TREATMENT INTENT:  Curative.      CURRENT DISEASE STATUS:  NED.    ECOG PERFORMANCE STATUS:  0.    PAIN SCORE:  0.    TOBACCO USE:  None.    MENOPAUSE STATUS:  Postmenopausal.      GENETIC TESTING:  Invitae 48 gene panel, VUS, MUTHYH.  No pathologic mutation identified.      CHIEF COMPLAINT:  Breast cancer.    HISTORY OF PRESENT ILLNESS:  Ms. Oguin returns to clinic today to discuss adjuvant endocrine therapy. She completed radiation on Friday. She reports fatigue and skin tenderness but tolerated radiation therapy well. Reviewing her menopausal status she believes she is post menopausal but has not had a cycle since her mid thirties after a partial hysterectomy. She has never had a bone density. She denies any fevers, chills, night sweats, chest pain, shortness of breath, nausea, vomiting, constipation, loose stools, melena, or hematochezia.  Energy level is good.  She is otherwise at her baseline.  She has no new complaints and feels well.      PAST MEDICAL HISTORY:  Osteoarthritis.    Hyperlipidemia.  Hypertension.  Breast cancer as above.     PAST SURGICAL HISTORY:  Simple hysterectomy.  Right breast bipsy.  Right knee surgery.       MEDICATIONS:  Losartan.  Rosuvastatin.  Claritin.  Vitamin D.     ALLERGIES:  Ibuprofen and aspirin cause anaphylaxis.      SOCIAL HISTORY:  She denies any tobacco use or illicit drugs.  She does drink social with less than 1-2 drinks per week.  She is married and currently works as an airline pilot.      FAMILY HISTORY:    Paternal uncle with colon cancer; maternal aunt with uterine cancer; paternal grandfather with gastric cancer; maternal grandfather with prostate cancer; maternal grandmother with colon cancer; great aunt with leukemia.    GYN HISTORY:    G5, P3.  Menarche age 60 or 69.  Age of first live birth was 71.  She was postmenopausal.  She is status post simple hysterectomy age 17.  She breastfed each of her 3 children for about 1-3 months.  She used Premarin vaginal cream given vaginal dryness for about a month but has discontinued this.       REVIEW OF SYSTEMS:   Review of Systems  is otherwise per interval history.     PHYSICAL EXAM:  Vital Signs:  BP 110/72 (Site: Left Upper Arm, Position: Sitting, Cuff Size: Large Adult)   Pulse 76   Temp 98.3 F (36.8 C) (Oral)   Resp 17   Wt 80.7 kg (178 lb)   SpO2 100%   BMI 34.76 kg/m     GENERAL: This is a delightful female in no acute distress, answering questions appropriately   HEENT: Mucous membranes are moist.    LUNGS: Demonstrate symmetric thoracic extension.     CARDIOVASCULAR: S1 and S2 that is regular.    ABDOMEN:  Soft, nondistended.    EXTREMITIES: Demonstrate no visible edema.     LABS:  Hematology:  Lab Results   Component Value Date    WBC 5.4 01/29/2023    RBC 4.50 01/29/2023    HGB 14.5 01/29/2023    HCT 43.3 01/29/2023    MCV 96.2 01/29/2023     MCH 32.2 (H) 01/29/2023    MCHC 33.5 01/29/2023    RDW 12.2 01/29/2023    PLT 231 01/29/2023    MPV 7.4 01/29/2023    BASOPCT 0.3 09/19/2022    MONOPCT 8.8 09/19/2022    EOSABS 0.1 09/19/2022    BASOSABS 0.0 09/19/2022    LYMPHSABS 0.9 01/29/2023    MONOSABS 0.6 09/19/2022     Chemistry:  Pending     IMAGING:  Reviewed and as above.    PATHOLOGY:  Reviewed and as above.    IMPRESSION:  Ms. Schachter is a 52 year old woman diagnosed with an endocrine receptor positive breast cancer status post lumpectomy and adjuvant radiation. We discussed the role of endocrine therapy in detail today again specifically outlining that the role of systemic therapy is to reduce the risk of micrometastatic disease and thereby development of stage IV cancer. The side effects of letrozole  were reviewed including hot flashes, arthralgias, myalgias, vaginal dryness and bone loss. She has not had a bone density and we will order that today. She is taking vitamin D. We will check her FSH, estradiol and vitamin D 25 hydroxyl level in addition to her CBC and CMP today. She is still recovering from the acute toxicities of radiation and would like to wait a couple of weeks before starting her AI. She understands we start within the 4 week window following radiation. We will see her in 6 weeks following initiating her medication for a toxicity assessment.      RECOMMENDATIONS:    Proceed with DEXA scan.  Institution of vitamin D3 2000 units daily. Check vitamin D 25 hydroxyl level today.   Fsh and estradiol today, if they are pre or perimenopausal levels I will call her and change to tamoxifen.   Letrozole  sent to local pharmacy, she will start before then end of September.   We will see her again the in early November.   The patient knows to call with any questions or issues as they arise.    Thank you for allowing me to participate in the care of this very pleasant woman.        Fred Franzen ACNP-BC

## 2023-01-30 LAB — FOLLICLE STIMULATING HORMONE: FSH: 134 m[IU]/mL

## 2023-01-30 LAB — VITAMIN D 25 HYDROXY: Vit D, 25-Hydroxy: 47.5 ng/mL (ref 30.0–90.0)

## 2023-01-30 LAB — ESTRADIOL: Estradiol: <25 pg/mL

## 2023-02-05 ENCOUNTER — Inpatient Hospital Stay: Admit: 2023-02-05 | Discharge: 2023-02-06 | Payer: BLUE CROSS/BLUE SHIELD | Attending: Radiation Oncology

## 2023-02-05 NOTE — On Treatment Visit (Signed)
 RADIATION TREATMENT MANAGEMENT NOTE     Encounter Date: 02/05/2023  Patient Name: Angela Gray  Medical Record Number: [id number]    DIAGNOSIS:  Invasive ductal Ca of left breast, pT1a N0.  Moderately differentiated.  Receptor positive.     TREATMENT SITE:  Right breast.    RADIATION DOSE/FX:  42.4 Gy in 16 fractions.  No boost.    ASSESSMENT:     Fraction #: 16 / 16   Completed XRT on January 25, 2023.  Chemotherapy/systemic therapy:  no  ED visit/admission in past week:  no      RECOMMENDATIONS:  Skin care.  Stop Udder cream.  Try aloe juice then aquaphor after aloe dries.  Aleve bid routine with food for another week then prn.  F/U one month or sooner if any concerns.  Will let us  know by 02/07/23 if skin is not beginning to improve.  Hydrocortisone topical to sternal area due itching or prn benadryl oral or topical.    SUBJECTIVE: The patient reports pain of 7/10 and fatigue 4/10. She hasn't been getting much sleep due to pain and could not get comfortable. She reports skin is angry. Particularly under the arm. She also reports it is very itchy. Patient given various samples of moisturizers today and will be seen for a skin check. /nt  Pt has severe anxiety and is undergoing counseling and using healthy lifestyle changes and exercise instead of medications.  Allergy to motrin.  Not diabetic.  Not exposing skin to sun.        PHYSICAL EXAM:  Vital Signs for this encounter:  BP 127/85   Pulse 69   Temp 98.1 F (36.7 C) (Oral)   Resp 14   Wt 80.6 kg (177 lb 11.2 oz)   SpO2 99%   BMI 34.70 kg/m   Pain score: 7 / 10  Last weight:    Wt Readings from Last 4 Encounters:   02/05/23 80.6 kg (177 lb 11.2 oz)   01/29/23 80.7 kg (178 lb)   01/22/23 80.1 kg (176 lb 9.6 oz)   01/15/23 80.5 kg (177 lb 6.4 oz)     General:  Alert and Orientated X 3.  No acute distress.  Good ROM right up. Ext.  Diffuse moderate erythema with some mild hyperpigmentation in axilla but no moist desquam.  Papular change over  sternal area.  S/P breast reduction.      NT, RN    Velia WENDI Borrow, MD  [facility]  Department of Radiation Oncology

## 2023-02-08 ENCOUNTER — Inpatient Hospital Stay: Admit: 2023-02-08 | Payer: BLUE CROSS/BLUE SHIELD

## 2023-02-08 DIAGNOSIS — Z13820 Encounter for screening for osteoporosis: Secondary | ICD-10-CM

## 2023-02-28 ENCOUNTER — Inpatient Hospital Stay: Admit: 2023-02-28 | Discharge: 2023-02-28 | Payer: BLUE CROSS/BLUE SHIELD | Attending: Radiation Oncology

## 2023-02-28 DIAGNOSIS — Z08 Encounter for follow-up examination after completed treatment for malignant neoplasm: Secondary | ICD-10-CM

## 2023-02-28 NOTE — Progress Notes (Signed)
 F/U breast cancer  Pt daughter with her today and pt is steady on his feet unassisted. After verifying pt name and DOB, got pt weight and vital signs. VSS. Pt denies pain. She states her skin in the treatment area is doing good. No learning barriers noted. H&P reviewed and updated, along with allergies and medications reconciled. Pt seen by [facility] for f/u and pt did not want to make a 3 month f/u today but states she will call back to get that scheduled. Pt was told by [facility] to make it before the end of this year as she will be retiring at the end of this year./bg

## 2023-02-28 NOTE — Progress Notes (Signed)
[facility]                           ONCOLOGY FOLLOW-UP      PATIENT NAME: Angela Gray, Angela Gray     DOB: 14-Apr-1971  MED REC NO: [id number]                       ROOM:   ACCOUNT NO: [id number]                       ADMIT DATE: 02/28/2023  PROVIDER: Elita Quick, MD    DATE OF SERVICE:  02/28/2023    DIAGNOSIS:  Invasive ductal carcinoma of right breast, stage pT1aN0.  Moderately differentiated and receptor positive.    HISTORY:  The patient is seen today for followup after completing radiation to the right breast done on January 25, 2023.  Skin reaction has resolved.  She used aloe and Aquaphor and currently has no issues with her breasts or skin in the treatment field.  She started Femara last week and has had some mood swings and bothersome side effects, but thinks this is getting better.  She had right meniscus surgery about 2 weeks ago and trying to increase her walking and trying to lose weight that she gained during her treatment.  She has no upper extremity edema.    PHYSICAL EXAMINATION:  VITAL SIGNS:  Weight 177.4.  Vital signs stable.  GENERAL:  Well appearing.  BREASTS:  Without palpable mass, skin retraction or dimpling, nipple inversion or discharge.  Right breast incisions with some mild edema and mild diffuse erythema, but overall much improved.  No moist desquamation.    IMPRESSION:  Clinically acute side effects, resolved.    PLAN:  The patient will continue routine moisturizing with gentle massaging of the right breast for 5-10 minutes routinely to decrease risk of chronic fibrosis and edema.  She will follow up here in about 3 months or before the end of the year.  She has routine followup with Dr. Cephas Darby and Dr. Cristi Loron.        Elita Quick, MD      LBC/AQS  D:  02/28/2023 11:21:05  T:  03/01/2023 00:07:41  JOB #:  [id number]    CC:   Jamesetta So, DO        JENNIFER APRN-NP [place]        Peter Minium, MD

## 2023-02-28 NOTE — Telephone Encounter (Signed)
 Navigator called the pt. to follow up now that she is finished with her active breast cancer treatment. The pt. verbalized that she was doing well and finally healing from her radiation treatment.The pt. reported that she really had success with wound healing by using Aloe and would recommend to all other patients suffering from radiation burns. The pt. was already moving forward and taking her prescribed AI and so far tolerating well. The pt. has her appointment with medical oncology next month.  The navigator briefly discussed survivorship and the importance of surveillance and follow up. The pt. mentioned that she has already followed back with her PCP and had a knee replacement that she had been waiting on. The pt. was excited to be finished with breast cancer treatment and had no other questions at this time. The navigator encouraged the pt to call with any needs in the future. The navigator will now sign off due to survivorship.

## 2023-04-09 ENCOUNTER — Encounter: Attending: Hematology & Oncology

## 2023-04-09 ENCOUNTER — Encounter

## 2023-04-28 ENCOUNTER — Ambulatory Visit: Admit: 2023-04-28 | Discharge: 2023-04-28 | Payer: BLUE CROSS/BLUE SHIELD

## 2023-04-28 ENCOUNTER — Ambulatory Visit: Admit: 2023-04-28 | Discharge: 2023-04-28 | Payer: BLUE CROSS/BLUE SHIELD | Attending: Physician Assistant

## 2023-04-28 LAB — AMB POC INFLUENZA ASSAY W/OPTIC
Flu A Antigen: NEGATIVE
Flu B Antigen: NEGATIVE

## 2023-04-28 LAB — POCT COVID-19 RAPID, NAAT: SARS-COV-2, RdRp gene: NEGATIVE

## 2023-04-28 MED ORDER — PREDNISONE 20 MG PO TABS
20 | ORAL_TABLET | Freq: Every day | ORAL | 0 refills | Status: AC
Start: 2023-04-28 — End: 2023-05-03

## 2023-04-28 MED ORDER — AZITHROMYCIN 250 MG PO TABS
250 | ORAL_TABLET | ORAL | 0 refills | Status: AC
Start: 2023-04-28 — End: 2023-05-08

## 2023-04-28 NOTE — Progress Notes (Signed)
 04/28/23     Angela Gray , female , 52 y.o.     52 yo female with history of breast CA and recent radiation presents with cough and chest congestion. Feels burning in her chest. Also has some sinus congestion. Denies fever, sore throat.

## 2023-05-21 ENCOUNTER — Inpatient Hospital Stay
Admit: 2023-05-21 | Discharge: 2023-05-22 | Disposition: A | Discharge status: Home / Self Care | Payer: BLUE CROSS/BLUE SHIELD | Attending: Radiation Oncology | Admitting: Surgery

## 2023-05-21 VITALS — BP 118/78 | HR 98 | Temp 98.10000°F | Resp 18 | Wt 180.1 lb

## 2023-05-21 DIAGNOSIS — Z08 Encounter for follow-up examination after completed treatment for malignant neoplasm: Principal | ICD-10-CM

## 2023-05-21 NOTE — Progress Notes (Signed)
 [facility]                           ONCOLOGY FOLLOW-UP      PATIENT NAME: Angela Gray, Angela Gray     DOB: Dec 15, 1970  MED REC NO: [id number]                       ROOM:   ACCOUNT NO: [id number]                       AD

## 2023-05-21 NOTE — Progress Notes (Signed)
 The patient was seen for a follow up after XRT for breast ca, with EOT on 01/25/23. Pt was unaccompanied to the appointment. She presented A&O times four, with a GCS of fifteen, and no complaints of pain. Pt's gait was steady, with no balance issues seen wh

## 2023-09-17 ENCOUNTER — Ambulatory Visit: Admit: 2023-09-17 | Discharge: 2023-09-17 | Payer: BLUE CROSS/BLUE SHIELD | Attending: Physician Assistant

## 2023-09-17 VITALS — BP 129/75 | HR 64 | Ht <= 58 in | Wt 175.0 lb

## 2023-09-17 DIAGNOSIS — C50311 Malignant neoplasm of lower-inner quadrant of right female breast: Secondary | ICD-10-CM

## 2023-09-17 NOTE — Progress Notes (Signed)
 BREAST SURGERY NEW PATIENT    09/17/2023      Chief Complaint   Patient presents with    New Patient     6th month CBE/ Mammo was done 05/16/2023        HPI:   Current Status:  Angela Gray is a 52 y.o. female who presents today to establish care. She was diagnosed with breast cancer after an abnormal screening mammogram which showed a 3 mm focal asymmetry in the RIGHT breast. The patient underwent RIGHT partial mastectomy and RIGHT sentinel lymph node biopsy on 10/24/2022, adjuvant XRT, and adjuvant endocrine therapy, exemestane by Dr. Cephas Darby, discontinued due to side effects, she also could not tolerate letrozole. She is using vaginal estrace cream. Denies palpable mass, nipple discharge, or skin changes.    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 Disease was centered at the 12 o'clock position, 5 cm from the nipple.   **Pathologic staging was performed based on biopsy given no residual disease at time of surgery. Original biopsy was performed at [facility] on 09/11/2022.     10/24/2022 Lumpectomy and sentinel lymph node biopsy.  01/25/2023 Adjuvant radiation complete with Dr Mat Carne.   02/2023 planned initiation of adjuvant letrozole.     Recent Imaging:  05/16/2023, bilateral diagnostic mammogram: no suspicious mammographic findings, heterogeneously dense tissue    OB/GYN History and Risk Factors for Breast Cancer:    Age at onset of periods: 3, Last Menstrual Period: 2005, Age at menopause: 24, Number of pregnancies:  5, Number of live births: 3, History of breast biopsy : yes, Age at first birth : 69, Years on Birth Control: none, Years on hormone replacement: no, Pregnant or breastfeeding now : no, Hx of radiation to chest: yes, Ashkenazi Jewish: no, Bra Size: 40B.    Prior to Admission medications    Medication Sig Start Date End Date Taking? Authorizing Provider   estradiol  (ESTRACE) 0.1 MG/GM vaginal cream APPLY 0.5 G OF CREAM VAGINALLY DAILY FOR 2 WEEKS, THEN REDUCE TO TWICE WEEKLY 30 DAYS 04/21/23  Yes [provider]   Misc Natural Products (ELDERBERRY IMMUNE COMPLEX) CHEW Take 2 Doses by mouth daily   Yes [provider]   Eszopiclone (LUNESTA PO) Take by mouth   Yes [provider]   vitamin D (ERGOCALCIFEROL) 1.25 MG (50000 UT) CAPS capsule Take 1 capsule by mouth once a week   Yes [provider]   losartan-hydroCHLOROthiazide (HYZAAR) 50-12.5 MG per tablet  08/02/22  Yes [provider]   Loratadine (CLARITIN PO)  06/19/14  Yes [provider]   rosuvastatin (CRESTOR) 20 MG tablet  08/01/22  Yes [provider]   exemestane (AROMASIN) 25 MG tablet Take 1 tablet by mouth daily  Patient not taking: Reported on 09/17/2023 04/09/23   [provider]   naproxen (NAPROSYN) 250 MG tablet Take 1 tablet by mouth 2 times daily (with meals)  Patient not taking: Reported on 09/17/2023    [provider]      Allergies   Allergen Reactions    Aspirin Anaphylaxis, Hives, Itching and Shortness Of Breath    Ibuprofen Anaphylaxis, Hives, Itching and Shortness Of Breath      Past Medical History:   Diagnosis Date    Arthritis 10/03/2019    Breast cancer 09/13/22    Cancer (HCC) 09/13/2022    Hyperlipidemia  Hypertension 09/18/2019      Past Surgical History:   Procedure Laterality Date    BREAST LUMPECTOMY Right 10/24/2022    KNEE SURGERY Right     PARTIAL HYSTERECTOMY (CERVIX NOT REMOVED)        Cancer-related family history includes Cancer in her paternal aunt and paternal grandfather; Colon Cancer in her maternal grandmother and maternal uncle; Prostate Cancer in her maternal grandfather.   Social History     Socioeconomic History    Marital status: Married     Spouse name: None    Number of children: None    Years of education: None    Highest education level: None   Tobacco Use    Smoking status: Never     Smokeless tobacco: Never   Vaping Use    Vaping status: Never Used   Substance and Sexual Activity    Alcohol use: Not Currently     Alcohol/week: 1.0 standard drink of alcohol     Types: 1 Drinks containing 0.5 oz of alcohol per week    Drug use: Never    Sexual activity: Yes     Partners: Male          Review of Systems    General/Constitutional:           Fever denies.  Chills denies.  Headache admits.  Fatigue admits.       Endocrine:           Weight gain admits.  Cold intolerance denies.  Heat intolerance denies.  Weight loss denies.         Breast:           Breast lump admits.  Breast pain admits.  Nipple discharge denies.  Red skin denies.       Musculoskeletal:           Back pain admits.  Muscle aches admits.  Painful joints admits.       Hematology:           Bleeding problems denies.  Enlarged Lymph nodes No.  Easy bruising denies.  Anemia denies.       Skin:           Rash denies.  Skin cancer denies.  Skin lesion(s) denies.         Neurologic:           Seizures denies.  Dizziness denies.  Fainting denies.       Psychiatric:           Depression admits.  Panic attacks denies.  Anxiety admits.         General Examination:  BP 129/75   Pulse 64   Ht 1.473 m (4\' 10" )   Wt 79.4 kg (175 lb)   SpO2 98%   BMI 36.58 kg/m             GENERAL APPEARANCE: well developed, well nourished, Patient is alert and oriented X 3. No acute distress. Appropriate affect, looks stated age.          HEAD: normocephalic, atraumatic.          NECK: neck supple, full range of motion.          LYMPH NODES:  no axillary, supraclavicular or cervical adenopathy.          BREASTS:  The patient was examined in the supine and upright positions.  RIGHT Breast: the skin, nipple and areola XRT skin changes, wise pattern reduction. No nipple discharge was able to be expressed today. There are no retractions or dimpling noted. There is generalized nodularity without a dominant mass. The axillary tail is normal.                      LEFT Breast: the skin, nipple and areola are normal, wise pattern reduction. No nipple discharge was able to be expressed today. There are no retractions or dimpling noted. There is generalized nodularity without a dominant mass. The axillary tail is normal.          SKIN: good turgor, no rashes noted, warm and dry.          MUSCULOSKELETAL: full range of motion, no swelling or deformity.          EXTREMITIES: no edema, clubbing or cyanosis.     Radiology:  EXAMS: Reason for Exam::   [id number] Tomo Mammo Diagnostic Bil HISTORY OF BREAST CANCER     BILATERAL DIGITAL DIAGNOSTIC MAMMOGRAM 3D/2D WITH CAD: 05/16/2023     CLINICAL: History of right breast cancer.     COMPARISONS: Comparison is made to exams dated: 10/23/2022   mammogram, 09/11/2022 mammogram, 08/30/2022 mammogram, 08/14/2022   mammogram, 11/02/2020 mammogram - [facility], and   10/28/2018 mammogram.     TECHNIQUE: 3D tomosynthesis images were obtained in multiple   projections of both breasts. Current study was evaluated with   Computer Aided Detection (CAD).     BREAST COMPOSITION: The breasts are heterogeneously dense, which   may obscure small masses.     FINDINGS:     No suspicious mammographic abnormalities are seen in either   breast. There are normal appearing post-lumpectomy changes of the   right breast.       BENIGN     RECOMMENDATIONS:   There is no mammographic evidence of malignancy. A follow-up   diagnostic mammogram in 12 months is recommended.       Conway Dennis M.D.   bm/penrad:05/16/2023 10:20:33        Assessment:    ICD-10-CM    1. Infiltrating ductal carcinoma of lower-inner quadrant of right breast in female  C50.311 MRI BREAST BILATERAL W WO CONTRAST     MAM TOMO DIGITAL DIAGNOSTIC BILATERAL      2. Heterogeneously dense tissue of both breasts on mammography  R92.333 MRI BREAST BILATERAL W WO CONTRAST     MAM TOMO DIGITAL DIAGNOSTIC BILATERAL          Plan:   53 y.o. woman presents to establish care, she was  treated for RIGHT breast cancer in 2024, patient underwent RIGHT partial mastectomy and RIGHT sentinel lymph node biopsy on 10/24/2022, adjuvant XRT, and adjuvant endocrine therapy, exemestane by Dr. Bellil, discontinued due to side effects, she also could not tolerate letrozole. Her most recent diagnostic mammogram returned benign. She and her prior breast surgeon had discussed following her with alternating mammogram and MRI for a period of time, which I think is reasonable given her intolerance to endocrine therapy and heterogeneously dense breast tissue.     Summary:  Continue breast surveillance  Bilateral diagnostic mammogram and CBE in 8 months  Bilateral breast MRI in 2 months  Continue follow up with Dr. Bellil  Call for changes on exam or concerns      I spent a total of 45 minutes reviewing the records, imaging studies, reports and counseling the patient regarding  the recommended treatment plan for her breast care and documentation in patient record.    Byrd Cast, PA-C

## 2023-10-13 ENCOUNTER — Encounter

## 2023-11-20 ENCOUNTER — Inpatient Hospital Stay: Admit: 2023-11-20 | Payer: BLUE CROSS/BLUE SHIELD

## 2023-11-20 DIAGNOSIS — C50311 Malignant neoplasm of lower-inner quadrant of right female breast: Secondary | ICD-10-CM

## 2023-11-20 MED ORDER — GADOTERIDOL 279.3 MG/ML IV SOLN
279.3 | Freq: Once | INTRAVENOUS | Status: AC | PRN
Start: 2023-11-20 — End: 2023-11-20
  Administered 2023-11-20: 16:00:00 20 mL via INTRAVENOUS

## 2024-02-24 ENCOUNTER — Ambulatory Visit: Admit: 2024-02-24 | Discharge: 2024-02-24 | Payer: BLUE CROSS/BLUE SHIELD | Attending: Physician Assistant

## 2024-02-24 VITALS — BP 110/66 | HR 87 | Temp 98.50000°F | Resp 18 | Ht <= 58 in | Wt 176.0 lb

## 2024-02-24 DIAGNOSIS — H6121 Impacted cerumen, right ear: Principal | ICD-10-CM

## 2024-02-24 NOTE — Progress Notes (Signed)
 Angela Gray is a 53 y.o. female     Chief Complaint   Patient presents with    Cerumen Impaction     Right ear only, muffled sounds. Slight painful, by a week         HPI  53 year old female who presents with fullness and pressure in the right ear.  Started about a week ago.  Has an occasional pain in the ear but overall not painful.  Does feel like hearing is a little decreased.  States it feels like something is in her ear.  No drainage from the ear.  No left ear pain, pressure or fullness.  No runny nose, congestion or sore throat.  No cough.  No fever, chills, headache or bodyaches.  Does not feel sick.  Otherwise feeling well.  No other complaints      Current Outpatient Medications   Medication Sig Dispense Refill    estradiol (ESTRACE) 0.1 MG/GM vaginal cream APPLY 0.5 G OF CREAM VAGINALLY DAILY FOR 2 WEEKS, THEN REDUCE TO TWICE WEEKLY 30 DAYS      Misc Natural Products (ELDERBERRY IMMUNE COMPLEX) CHEW Take 2 Doses by mouth daily      Eszopiclone (LUNESTA PO) Take by mouth      vitamin D (ERGOCALCIFEROL) 1.25 MG (50000 UT) CAPS capsule Take 1 capsule by mouth once a week      losartan-hydroCHLOROthiazide (HYZAAR) 50-12.5 MG per tablet       Loratadine (CLARITIN PO)       rosuvastatin (CRESTOR) 20 MG tablet       exemestane (AROMASIN) 25 MG tablet Take 1 tablet by mouth daily (Patient not taking: Reported on 02/24/2024)      naproxen (NAPROSYN) 250 MG tablet Take 1 tablet by mouth 2 times daily (with meals) (Patient not taking: Reported on 02/24/2024)       No current facility-administered medications for this visit.        Past Medical History:   Diagnosis Date    Arthritis 10/03/2019    Breast cancer (HCC) 09/13/22    Cancer (HCC) 09/13/2022    Hyperlipidemia     Hypertension 09/18/2019        Allergies   Allergen Reactions    Aspirin Anaphylaxis, Hives, Itching and Shortness Of Breath    Ibuprofen Anaphylaxis, Hives, Itching and Shortness Of Breath        Past Surgical History:   Procedure  Laterality Date    BREAST LUMPECTOMY Right 10/24/2022    KNEE SURGERY Right     PARTIAL HYSTERECTOMY (CERVIX NOT REMOVED)          Family History   Problem Relation Age of Onset    Arthritis Mother     High Blood Pressure Mother     High Cholesterol Mother     Osteoarthritis Mother     Alcohol Abuse Father     Arthritis Father     Alcohol Abuse Maternal Grandfather     Prostate Cancer Maternal Grandfather     Alcohol Abuse Maternal Grandmother     Colon Cancer Maternal Grandmother     High Blood Pressure Maternal Grandmother     High Cholesterol Maternal Grandmother     Cancer Paternal Grandfather     Alcohol Abuse Maternal Uncle     Colon Cancer Maternal Uncle     Alcohol Abuse Sister     Cancer Paternal Aunt     Diabetes Paternal Uncle     Diabetes Paternal Cousin  Heart Attack Maternal Uncle     Substance Abuse Paternal Uncle     Substance Abuse Paternal Cousin     Substance Abuse Maternal Uncle         Social History     Socioeconomic History    Marital status: Married     Spouse name: Not on file    Number of children: Not on file    Years of education: Not on file    Highest education level: Not on file   Occupational History    Not on file   Tobacco Use    Smoking status: Never    Smokeless tobacco: Never   Vaping Use    Vaping status: Never Used   Substance and Sexual Activity    Alcohol use: Not Currently     Alcohol/week: 1.0 standard drink of alcohol     Types: 1 Drinks containing 0.5 oz of alcohol per week    Drug use: Never    Sexual activity: Yes     Partners: Male   Other Topics Concern    Not on file   Social History Narrative    Not on file     Social Drivers of Health     Financial Resource Strain: Not on file   Food Insecurity: Not on file   Transportation Needs: Not on file   Physical Activity: Not on file   Stress: Not on file   Social Connections: Not on file   Intimate Partner Violence: Not on file   Housing Stability: Not on file          ROS  Other than what was mentioned in the history  of present illness, all other ROS negative        VITAL SIGNS  BP 110/66   Pulse 87   Temp 98.5 F (36.9 C) (Oral)   Resp 18   Ht 1.473 m (4' 10)   Wt 79.8 kg (176 lb)   SpO2 98%   BMI 36.78 kg/m        PHYSICAL EXAM  Physical Exam  Constitutional:       General: She is not in acute distress.     Appearance: She is not ill-appearing.   HENT:      Head: Normocephalic and atraumatic.      Right Ear: External ear normal.      Left Ear: Tympanic membrane, ear canal and external ear normal.      Ears:      Comments: Right ear: Complete cerumen impaction     Nose: Nose normal. No rhinorrhea.      Mouth/Throat:      Mouth: Mucous membranes are moist.      Pharynx: No oropharyngeal exudate or posterior oropharyngeal erythema.   Eyes:      Extraocular Movements: Extraocular movements intact.      Pupils: Pupils are equal, round, and reactive to light.   Cardiovascular:      Rate and Rhythm: Normal rate and regular rhythm.      Heart sounds: No murmur heard.     No gallop.   Pulmonary:      Effort: Pulmonary effort is normal. No respiratory distress.      Breath sounds: Normal breath sounds. No wheezing, rhonchi or rales.   Musculoskeletal:         General: No swelling or deformity. Normal range of motion.      Cervical back: Normal range of motion.   Lymphadenopathy:  Cervical: No cervical adenopathy.   Skin:     General: Skin is warm and dry.      Findings: No rash.   Neurological:      General: No focal deficit present.      Mental Status: She is alert.      Gait: Gait normal.   Psychiatric:         Mood and Affect: Mood normal.            ASSESSMENT & PLAN  1. Impacted cerumen of right ear  -     REMOVAL IMPACTED CERUMEN IRRIGATION/LVG UNILAT     Cerumen removal: large amount of cerumen removed from right canal with irrigation with warm water and peroxide by CMA Shanice after Debrox was applied prior to flushing. Patient tolerated procedure well. Symptoms were much improved. Hearing improved. Ear inspected  by me afterwards and TM intact, no cerumen noted and no sign of infection.  If develops any new symptoms should be reevaluated at that time.  ER for any severe or alarming signs or symptoms.  Patient agreed and verbalized understanding          An electronic signature was used to authenticate this note.    --Meade DELENA Foyer, PA-C       Please note that this document was generated using voice recognition Dragon dictation software.  Although every effort was made to ensure the accuracy of this automated transcription, some errors in transcription may have occurred.     (Note to patient: The 21st Century Cures Act requires that medical notes like this be available to patients in the interest of transparency. However, be advised this is a medical document. It is intended as peer to peer communication. It is written in medical language and may contain abbreviations or verbiage that are unfamiliar. It may appear blunt or direct. Medical documents are intended to carry relevant information, facts as evident, and the clinical opinion of the practitioner.)

## 2024-05-18 ENCOUNTER — Encounter: Attending: Physician Assistant

## 2024-05-18 ENCOUNTER — Ambulatory Visit: Payer: BLUE CROSS/BLUE SHIELD

## 2024-05-21 ENCOUNTER — Inpatient Hospital Stay: Admit: 2024-05-21 | Payer: BLUE CROSS/BLUE SHIELD

## 2024-05-21 DIAGNOSIS — C50311 Malignant neoplasm of lower-inner quadrant of right female breast: Principal | ICD-10-CM

## 2024-05-25 ENCOUNTER — Ambulatory Visit: Admit: 2024-05-25 | Discharge: 2024-05-25 | Payer: BLUE CROSS/BLUE SHIELD | Attending: Physician Assistant

## 2024-05-25 VITALS — Ht <= 58 in | Wt 175.0 lb

## 2024-05-25 DIAGNOSIS — C50311 Malignant neoplasm of lower-inner quadrant of right female breast: Principal | ICD-10-CM

## 2024-05-25 NOTE — Progress Notes (Signed)
 "  BREAST SURGERY FOLLOW UP    05/25/2024      Chief Complaint   Patient presents with    Follow-up     6 month follow up        HPI:   Current Status:  Angela Gray is a 53 y.o. female who presents today for 6 month follow up. She was diagnosed with breast cancer after an abnormal screening mammogram which showed a 3 mm focal asymmetry in the RIGHT breast. The patient underwent RIGHT partial mastectomy and RIGHT sentinel lymph node biopsy on 10/24/2022, adjuvant XRT, and adjuvant endocrine therapy, exemestane by Dr. Bellil, discontinued due to side effects, she also could not tolerate letrozole . She is using vaginal estrace cream. Denies palpable mass, nipple discharge, or skin changes. Chronic, stable breast pain.    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 Disease was centered at the 12 o'clock position, 5 cm from the nipple.   **Pathologic staging was performed based on biopsy given no residual disease at time of surgery. Original biopsy was performed at [facility] on 09/11/2022.     10/24/2022 Lumpectomy and sentinel lymph node biopsy with DR. Delon Barks  01/25/2023 Adjuvant radiation complete with Dr Roslynn.   02/2023 planned initiation of adjuvant letrozole .     Recent Imaging:  05/21/2024, bilateral diagnostic mammogram: no suspicious mammographic findings, heterogeneously dense tissue    OB/GYN History and Risk Factors for Breast Cancer:    Age at onset of periods: 28, Last Menstrual Period: 2005, Age at menopause: 14, Number of pregnancies:  5, Number of live births: 3, History of breast biopsy : yes, Age at first birth : 59, Years on Birth Control: none, Years on hormone replacement: no, Pregnant or breastfeeding now : no, Hx of radiation to chest: yes, Ashkenazi Jewish: no, Bra Size: 40B.    Prior to Admission medications   Medication Sig Start Date End Date Taking?  Authorizing Provider   estradiol (ESTRACE) 0.1 MG/GM vaginal cream APPLY 0.5 G OF CREAM VAGINALLY DAILY FOR 2 WEEKS, THEN REDUCE TO TWICE WEEKLY 30 DAYS 04/21/23   [provider]   Misc Natural Products (ELDERBERRY IMMUNE COMPLEX) CHEW Take 2 Doses by mouth daily    [provider]   exemestane (AROMASIN) 25 MG tablet Take 1 tablet by mouth daily  Patient not taking: Reported on 02/24/2024 04/09/23   [provider]   naproxen (NAPROSYN) 250 MG tablet Take 1 tablet by mouth 2 times daily (with meals)  Patient not taking: Reported on 02/24/2024    [provider]   Eszopiclone (LUNESTA PO) Take by mouth    [provider]   vitamin D (ERGOCALCIFEROL) 1.25 MG (50000 UT) CAPS capsule Take 1 capsule by mouth once a week    [provider]   losartan-hydroCHLOROthiazide (HYZAAR) 50-12.5 MG per tablet  08/02/22   [provider]   Loratadine (CLARITIN PO)  06/19/14   [provider]   rosuvastatin (CRESTOR) 20 MG tablet  08/01/22   [provider]      Allergies   Allergen Reactions    Aspirin Anaphylaxis, Hives, Itching and Shortness Of Breath    Ibuprofen Anaphylaxis, Hives, Itching and Shortness Of Breath      Past Medical History:   Diagnosis Date    Anxiety     Arthritis 10/03/2019    Breast cancer (HCC) 09/13/22  Cancer (HCC) 09/13/2022    Headache     Hyperlipidemia     Hypertension 09/18/2019    Irritable bowel syndrome     Osteoarthritis       Past Surgical History:   Procedure Laterality Date    BREAST LUMPECTOMY Right 10/24/2022    BREAST SURGERY Bilateral     LIFT    KNEE SURGERY Right     PARTIAL HYSTERECTOMY (CERVIX NOT REMOVED)        Cancer-related family history includes Cancer in her paternal aunt and paternal grandfather; Colon Cancer in her maternal grandmother and maternal uncle; Prostate Cancer in her maternal grandfather.   Social History     Socioeconomic History    Marital status: Married     Spouse name: None    Number  of children: None    Years of education: None    Highest education level: None   Tobacco Use    Smoking status: Never    Smokeless tobacco: Never   Vaping Use    Vaping status: Never Used   Substance and Sexual Activity    Alcohol use: Not Currently     Alcohol/week: 1.0 standard drink of alcohol     Types: 1 Standard drinks or equivalent per week    Drug use: Never    Sexual activity: Yes     Partners: Male          Review of Systems    General/Constitutional:           Fever denies.  Chills denies.  Headache admits.  Fatigue admits.       Endocrine:           Weight gain admits.  Cold intolerance denies.  Heat intolerance denies.  Weight loss denies.         Breast:           Breast lump denies.  Breast pain admits.  Nipple discharge denies.  Red skin denies.       Musculoskeletal:           Back pain admits.  Muscle aches admits.  Painful joints admits.       Hematology:           Bleeding problems denies.  Enlarged Lymph nodes No.  Easy bruising denies.  Anemia denies.       Skin:           Rash denies.  Skin cancer denies.  Skin lesion(s) denies.         Psychiatric:           Depression admits.  Panic attacks denies.  Anxiety admits.         General Examination:  Ht 1.473 m (4' 10)   Wt 79.4 kg (175 lb)   BMI 36.58 kg/m             GENERAL APPEARANCE: well developed, well nourished, Patient is alert and oriented X 3. No acute distress. Appropriate affect, looks stated age.          HEAD: normocephalic, atraumatic.          NECK: neck supple, full range of motion.          LYMPH NODES:  no axillary, supraclavicular or cervical adenopathy.          BREASTS:  The patient was examined in the supine and upright positions.  RIGHT Breast: the skin, nipple and areola XRT skin changes, wise pattern reduction. No nipple discharge was able to be expressed today. There are no retractions or dimpling noted. There is generalized nodularity without a dominant mass. The axillary tail is normal.                      LEFT Breast: the skin, nipple and areola are normal, wise pattern reduction. No nipple discharge was able to be expressed today. There are no retractions or dimpling noted. There is generalized nodularity without a dominant mass. The axillary tail is normal.          SKIN: good turgor, no rashes noted, warm and dry.          MUSCULOSKELETAL: full range of motion, no swelling or deformity.          EXTREMITIES: no edema, clubbing or cyanosis.     Radiology:  EXAMS: Reason for Exam::   [id number] Tomo Mammo Diagnostic Bil HISTORY OF BREAST CANCER     BILATERAL DIGITAL DIAGNOSTIC MAMMOGRAM 3D/2D WITH CAD: 05/16/2023     CLINICAL: History of right breast cancer.     COMPARISONS: Comparison is made to exams dated: 10/23/2022   mammogram, 09/11/2022 mammogram, 08/30/2022 mammogram, 08/14/2022   mammogram, 11/02/2020 mammogram - [facility], and   10/28/2018 mammogram.     TECHNIQUE: 3D tomosynthesis images were obtained in multiple   projections of both breasts. Current study was evaluated with   Computer Aided Detection (CAD).     BREAST COMPOSITION: The breasts are heterogeneously dense, which   may obscure small masses.     FINDINGS:     No suspicious mammographic abnormalities are seen in either   breast. There are normal appearing post-lumpectomy changes of the   right breast.       BENIGN     RECOMMENDATIONS:   There is no mammographic evidence of malignancy. A follow-up   diagnostic mammogram in 12 months is recommended.       Redell Melia M.D.   bm/penrad:05/16/2023 10:20:33        Assessment:    ICD-10-CM    1. 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 Heterogeneously dense tissue of both breasts on mammography  R92.333 MRI BREAST BILATERAL W WO CONTRAST            Plan:   53 y.o. woman presents for 6 month follow up, she was treated for RIGHT breast cancer in 2024, patient underwent RIGHT partial  mastectomy and RIGHT sentinel lymph node biopsy on 10/24/2022, adjuvant XRT, and adjuvant endocrine therapy, exemestane by Dr. Bellil, discontinued due to side effects, she also could not tolerate letrozole . Her most recent diagnostic mammogram returned benign. She and her prior breast surgeon had discussed following her with alternating mammogram and MRI for a period of time, which I think is reasonable given her intolerance to endocrine therapy and heterogeneously dense breast tissue.     Plan for bilateral breast MRI and exam in 6 months.     We discussed survivorship issues today including healthy diet, exercise, routine healthcare maintenance, and psychosocial health.  No referrals required today.     Summary:  Continue breast surveillance  Bilateral diagnostic mammogram and CBE in 12 months  Bilateral breast MRI in 6 months  Continue follow up with Dr. Bellil  Call for changes on exam  or concerns      I spent a total of 25 minutes reviewing the records, imaging studies, reports and counseling the patient regarding the recommended treatment plan for her breast care and documentation in patient record.    Vernell Abler, PA-C    "
